# Patient Record
Sex: Male | Born: 1985 | Race: Black or African American | Hispanic: No | Marital: Single | Smoking: Current every day smoker
Health system: Southern US, Community
[De-identification: ages and names within clinical notes are randomized; demographics above are authoritative.]

## PROBLEM LIST (undated history)

## (undated) DIAGNOSIS — R05 Cough: Secondary | ICD-10-CM

## (undated) DIAGNOSIS — R059 Cough, unspecified: Secondary | ICD-10-CM

## (undated) DIAGNOSIS — D869 Sarcoidosis, unspecified: Secondary | ICD-10-CM

## (undated) HISTORY — DX: Cough, unspecified: R05.9

## (undated) HISTORY — DX: Cough: R05

## (undated) HISTORY — DX: Sarcoidosis, unspecified: D86.9

---
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
## 2010-07-22 ENCOUNTER — Emergency Department (HOSPITAL_BASED_OUTPATIENT_CLINIC_OR_DEPARTMENT_OTHER)
Admission: EM | Admit: 2010-07-22 | Discharge: 2010-07-22 | Payer: Self-pay | Source: Home / Self Care | Admitting: Emergency Medicine

## 2010-10-28 LAB — GLUCOSE, CAPILLARY: Glucose-Capillary: 109 mg/dL — ABNORMAL HIGH (ref 70–99)

## 2010-10-28 LAB — CULTURE, ROUTINE-ABSCESS

## 2011-05-06 ENCOUNTER — Encounter: Payer: Self-pay | Admitting: *Deleted

## 2011-05-06 ENCOUNTER — Emergency Department (INDEPENDENT_AMBULATORY_CARE_PROVIDER_SITE_OTHER): Payer: Self-pay

## 2011-05-06 ENCOUNTER — Emergency Department (HOSPITAL_BASED_OUTPATIENT_CLINIC_OR_DEPARTMENT_OTHER)
Admission: EM | Admit: 2011-05-06 | Discharge: 2011-05-07 | Disposition: A | Discharge status: Home / Self Care | Payer: Self-pay | Attending: Emergency Medicine | Admitting: Emergency Medicine

## 2011-05-06 DIAGNOSIS — J3489 Other specified disorders of nose and nasal sinuses: Secondary | ICD-10-CM | POA: Insufficient documentation

## 2011-05-06 DIAGNOSIS — R059 Cough, unspecified: Secondary | ICD-10-CM | POA: Insufficient documentation

## 2011-05-06 DIAGNOSIS — R0602 Shortness of breath: Secondary | ICD-10-CM

## 2011-05-06 DIAGNOSIS — F17200 Nicotine dependence, unspecified, uncomplicated: Secondary | ICD-10-CM | POA: Insufficient documentation

## 2011-05-06 DIAGNOSIS — R911 Solitary pulmonary nodule: Secondary | ICD-10-CM

## 2011-05-06 DIAGNOSIS — J984 Other disorders of lung: Secondary | ICD-10-CM | POA: Insufficient documentation

## 2011-05-06 DIAGNOSIS — R05 Cough: Secondary | ICD-10-CM

## 2011-05-06 NOTE — ED Notes (Signed)
Pt c/o pro cough x 3 months with chest congestion

## 2011-05-06 NOTE — ED Notes (Signed)
Pt has an hx of asthma and is a smoker but is presented to ED with a cough that has been going on for 3 months. Pt is no respiratory distress.

## 2011-05-07 ENCOUNTER — Emergency Department (INDEPENDENT_AMBULATORY_CARE_PROVIDER_SITE_OTHER): Payer: Self-pay

## 2011-05-07 ENCOUNTER — Inpatient Hospital Stay (HOSPITAL_COMMUNITY): Admission: AD | Admit: 2011-05-07 | Payer: Self-pay | Source: Other Acute Inpatient Hospital

## 2011-05-07 DIAGNOSIS — R05 Cough: Secondary | ICD-10-CM

## 2011-05-07 DIAGNOSIS — R0989 Other specified symptoms and signs involving the circulatory and respiratory systems: Secondary | ICD-10-CM

## 2011-05-07 DIAGNOSIS — J984 Other disorders of lung: Secondary | ICD-10-CM

## 2011-05-07 LAB — BASIC METABOLIC PANEL
GFR calc Af Amer: >60 mL/min (ref >60)
GFR calc non Af Amer: >60 mL/min (ref >60)
Glucose, Bld: 100 mg/dL — ABNORMAL HIGH (ref 70–99)
Potassium: 3.8 mEq/L (ref 3.5–5.1)
Sodium: 139 mEq/L (ref 135–145)

## 2011-05-07 LAB — CBC
Hemoglobin: 14.1 g/dL (ref 13.0–17.0)
MCHC: 36.9 g/dL — ABNORMAL HIGH (ref 30.0–36.0)
RDW: 13.7 % (ref 11.5–15.5)
WBC: 11.9 10*3/uL — ABNORMAL HIGH (ref 4.0–10.5)

## 2011-05-07 MED ORDER — METHYLPREDNISOLONE SODIUM SUCC 125 MG IJ SOLR
125.0000 mg | Freq: Once | INTRAMUSCULAR | Status: AC
  Administered 2011-05-07: 125 mg via INTRAVENOUS
  Filled 2011-05-07: qty 2

## 2011-05-07 MED ORDER — ALBUTEROL SULFATE HFA 108 (90 BASE) MCG/ACT IN AERS
2.0000 | INHALATION_SPRAY | Freq: Once | RESPIRATORY_TRACT | Status: AC
  Administered 2011-05-07: 2 via RESPIRATORY_TRACT
  Filled 2011-05-07: qty 6.7

## 2011-05-07 MED ORDER — IOHEXOL 300 MG/ML  SOLN
80.0000 mL | Freq: Once | INTRAMUSCULAR | Status: AC | PRN
  Administered 2011-05-07: 80 mL via INTRAVENOUS

## 2011-05-07 MED ORDER — DIPHENHYDRAMINE HCL 50 MG/ML IJ SOLN
50.0000 mg | Freq: Once | INTRAMUSCULAR | Status: AC
  Administered 2011-05-07: 50 mg via INTRAVENOUS
  Filled 2011-05-07: qty 1

## 2011-05-07 MED ORDER — FAMOTIDINE IN NACL 20-0.9 MG/50ML-% IV SOLN
20.0000 mg | Freq: Once | INTRAVENOUS | Status: AC
  Administered 2011-05-07: 20 mg via INTRAVENOUS
  Filled 2011-05-07: qty 50

## 2011-05-07 NOTE — ED Provider Notes (Signed)
I spoke with Dr. Sung Amabile of pulmonary critical care was evaluated the patient's chest x-ray and CT scan.  He questions whether or not whether or not the patient is to be admitted to the hospital.  At this time the patient is well appearing with normal vital signs including a normal O2 sat.  Dr Sung Amabile has patient's phone number and he'll be setting the patient up for bronchoscopy tomorrow.  Patient and family have been informed of this.  The patient be discharged home with an albuterol inhaler.  No antibiotics at this time.  The suspicion at this time by Dr. Sung Amabile is possible sarcoidosis although he will know further after evaluating the patient and bronchoscopy.  The patient has been instructed to lay low for the next several days and minimize his contact with public.  He bent over to the ER for any new or worsening symptoms.  Lyanne Co, MD 05/07/11 1102

## 2011-05-07 NOTE — ED Notes (Signed)
Pt return from CT, no adverse rxn noted. Pt updated on plan of care

## 2011-05-07 NOTE — ED Notes (Signed)
Pt moved to negative pressure room per MD order. Pt and family made aware of wait for inpt room at [facility]. No new complaints at this time.

## 2011-05-07 NOTE — ED Notes (Signed)
Family member noted holding baby and attempting to place mask on baby.  Family informed that no children may enter room as pt is on airborne precautions as explained earlier.  Family member verbalizes understanding, pt is waving to baby through window in door. Family member and baby noted leaving department to waiting room by nursing secretary.

## 2011-05-07 NOTE — ED Notes (Signed)
Pt mother now noted standing in door of room with door propped open speaking with family member from earlier notes. Mother is not wearing mask.  Mother asked to step out of room, keep door closed, and place mask.  Mother refuses, states "I'm not wearing that thing, and you can't stop me from seeing my son!" mother enters room and refuses to place mask despite encouragement from this rn.  Ophelia Shoulder, Interior and spatial designer and dr. Patria Mane alerted to situation. Family instructed to keep door closed at all times, and importance of precautions re-explained.  Mother states "I understand, I'm not stupid, but I am not wearing that damn thing and you are not going to keep me from my son!" mother enters room without mask in place.

## 2011-05-07 NOTE — ED Notes (Signed)
Family member noted going into room without mask, stopped by this rn and asked to place mask, airborne precautions explained.  Family member verbalizes understanding, but does not put on mask and attempts to open room door.  Family member instructed to apply mask before entering room, pt assisted with placing mask by this rn, and escorted into room.

## 2011-05-07 NOTE — ED Notes (Signed)
Pt and family updated on plan of care and need to wait one hour after being pre-medicated before pt can go to CT scanner. NAD noted, IV site unremarkable.

## 2011-05-07 NOTE — ED Notes (Signed)
Pt sleeping with resp easy and reg, arouses to spoken name to deny any c/o.  Pt remains on airborne precautions per protocol.

## 2011-05-07 NOTE — ED Provider Notes (Signed)
History     CSN: [id number] Arrival date & time: 05/06/2011 10:05 PM   Chief Complaint  Patient presents with  . Nasal Congestion  . Cough     (Include location/radiation/quality/duration/timing/severity/associated sxs/prior treatment) Patient is a 25 y.o. male presenting with cough. The history is provided by the patient. No language interpreter was used.  Cough This is a new problem. The current episode started more than 1 week ago. The problem occurs constantly. The problem has not changed since onset.The cough is non-productive. There has been no fever. Pertinent negatives include no chest pain, no chills, no sweats, no weight loss, no ear congestion, no ear pain, no headaches, no rhinorrhea, no sore throat, no myalgias, no shortness of breath, no wheezing and no eye redness. He has tried nothing for the symptoms. The treatment provided no relief. Risk factors: smoker. He is a smoker. His past medical history does not include bronchitis, pneumonia, bronchiectasis, COPD, emphysema or asthma.     Past Medical History  Diagnosis Date  . Asthma      History reviewed. No pertinent past surgical history.  History reviewed. No pertinent family history.  History  Substance Use Topics  . Smoking status: Current Everyday Smoker -- 1.0 packs/day  . Smokeless tobacco: Not on file  . Alcohol Use: No      Review of Systems  Constitutional: Negative for fever, chills, weight loss, diaphoresis, activity change, appetite change, fatigue and unexpected weight change.  HENT: Negative for ear pain, sore throat, facial swelling, rhinorrhea and neck pain.   Eyes: Negative for discharge and redness.  Respiratory: Positive for cough. Negative for choking, shortness of breath, wheezing and stridor.   Cardiovascular: Negative for chest pain, palpitations and leg swelling.  Gastrointestinal: Negative for abdominal distention.  Genitourinary: Negative for difficulty urinating.  Musculoskeletal:  Negative for myalgias and arthralgias.  Neurological: Negative for headaches.  Hematological: Negative.   Psychiatric/Behavioral: Negative.     Allergies  Aspirin; Bee; Ivp dye; and Shellfish allergy  Home Medications   Current Outpatient Rx  Name Route Sig Dispense Refill  . HYDROCORTISONE 1 % EX LOTN Topical Apply topically 2 (two) times daily as needed. For eczema       Physical Exam    BP 122/66  Pulse 80  Temp 97.8 F (36.6 C)  Resp 15  Ht 5\' 6"  (1.676 m)  Wt 220 lb (99.791 kg)  BMI 35.51 kg/m2  SpO2 99%  Physical Exam  Constitutional: He is oriented to person, place, and time. He appears well-developed and well-nourished.  HENT:  Head: Normocephalic and atraumatic.  Eyes: EOM are normal. Pupils are equal, round, and reactive to light.  Neck: Normal range of motion. Neck supple. No JVD present. No tracheal deviation present. No thyromegaly present.  Cardiovascular: Normal rate and regular rhythm.   Pulmonary/Chest: Effort normal and breath sounds normal. No stridor. No respiratory distress. He has no wheezes. He has no rales. He exhibits no tenderness.  Abdominal: Soft. Bowel sounds are normal. He exhibits no distension.  Musculoskeletal: Normal range of motion.  Lymphadenopathy:    He has no cervical adenopathy.  Neurological: He is alert and oriented to person, place, and time.  Skin: Skin is warm and dry.  Psychiatric: He has a normal mood and affect.    ED Course  Procedures  Results for orders placed during the hospital encounter of 05/06/11  CBC      Component Value Range   WBC 11.9 (*) 4.0 - 10.5 (K/uL)  RBC 5.04  4.22 - 5.81 (MIL/uL)   Hemoglobin 14.1  13.0 - 17.0 (g/dL)   HCT 16.1 (*) 09.6 - 52.0 (%)   MCV 75.8 (*) 78.0 - 100.0 (fL)   MCH 28.0  26.0 - 34.0 (pg)   MCHC 36.9 (*) 30.0 - 36.0 (g/dL)   RDW 04.5  40.9 - 81.1 (%)   Platelets 336  150 - 400 (K/uL)  BASIC METABOLIC PANEL      Component Value Range   Sodium 139  135 - 145 (mEq/L)    Potassium 3.8  3.5 - 5.1 (mEq/L)   Chloride 99  96 - 112 (mEq/L)   CO2 28  19 - 32 (mEq/L)   Glucose, Bld 100 (*) 70 - 99 (mg/dL)   BUN 13  6 - 23 (mg/dL)   Creatinine, Ser 9.14 (*) 0.50 - 1.35 (mg/dL)   Calcium 9.5  8.4 - 78.2 (mg/dL)   GFR calc non Af Amer >60  >60 (mL/min)   GFR calc Af Amer >60  >60 (mL/min)   Dg Chest 2 View  05/06/2011  *RADIOLOGY REPORT*  Clinical Data: 25 year old male with chronic cough.  Shortness of breath.  CHEST - 2 VIEW  Comparison: None  Findings: The cardiomediastinal silhouette is unremarkable. Bilateral pulmonary nodular/ mass-like opacities are identified. There is no evidence of pleural effusion or pneumothorax. No acute bony abnormalities are present.  IMPRESSION: Bilateral pulmonary nodular/mass-like opacities suspicious for metastatic disease or atypical infection.  Chest CT with contrast is recommended for further evaluation.  Original Report Authenticated By: Rosendo Gros, M.D.     No diagnosis found.   MDM Case d/w Dr. Sherene Sires, will need isolation and bronch, admit to medicine with pulmonary consult       Nazier Neyhart K Osmar Howton-Rasch, MD 05/07/11 9562

## 2011-05-07 NOTE — ED Notes (Signed)
Pt and family made aware of wait for inpt bed, IV site unremarkable, no rxn to CT dye noted.

## 2011-05-08 ENCOUNTER — Ambulatory Visit (HOSPITAL_COMMUNITY)
Admission: RE | Admit: 2011-05-08 | Discharge: 2011-05-08 | Disposition: A | Discharge status: Home / Self Care | Payer: Self-pay | Source: Ambulatory Visit | Attending: Pulmonary Disease | Admitting: Pulmonary Disease

## 2011-05-08 ENCOUNTER — Other Ambulatory Visit: Payer: Self-pay | Admitting: Pulmonary Disease

## 2011-05-08 ENCOUNTER — Encounter (HOSPITAL_COMMUNITY): Payer: Self-pay

## 2011-05-08 ENCOUNTER — Ambulatory Visit (HOSPITAL_COMMUNITY)
Admit: 2011-05-08 | Discharge: 2011-05-08 | Disposition: A | Discharge status: Home / Self Care | Payer: Self-pay | Source: Ambulatory Visit | Attending: Pulmonary Disease | Admitting: Pulmonary Disease

## 2011-05-08 DIAGNOSIS — R918 Other nonspecific abnormal finding of lung field: Secondary | ICD-10-CM

## 2011-05-08 DIAGNOSIS — R05 Cough: Secondary | ICD-10-CM

## 2011-05-08 DIAGNOSIS — R52 Pain, unspecified: Secondary | ICD-10-CM

## 2011-05-08 DIAGNOSIS — R040 Epistaxis: Secondary | ICD-10-CM | POA: Insufficient documentation

## 2011-05-08 DIAGNOSIS — Z01818 Encounter for other preprocedural examination: Secondary | ICD-10-CM | POA: Insufficient documentation

## 2011-05-08 LAB — PNEUMOCYSTIS JIROVECI SMEAR BY DFA: Pneumocystis jiroveci Ag: NEGATIVE

## 2011-05-11 ENCOUNTER — Encounter: Payer: Self-pay | Admitting: Critical Care Medicine

## 2011-05-11 ENCOUNTER — Telehealth: Payer: Self-pay | Admitting: Critical Care Medicine

## 2011-05-11 ENCOUNTER — Other Ambulatory Visit: Payer: Self-pay | Admitting: Critical Care Medicine

## 2011-05-11 ENCOUNTER — Ambulatory Visit (HOSPITAL_BASED_OUTPATIENT_CLINIC_OR_DEPARTMENT_OTHER)
Admission: RE | Admit: 2011-05-11 | Discharge: 2011-05-11 | Disposition: A | Discharge status: Home / Self Care | Payer: Managed Care, Other (non HMO) | Source: Ambulatory Visit | Attending: Critical Care Medicine | Admitting: Critical Care Medicine

## 2011-05-11 ENCOUNTER — Ambulatory Visit (INDEPENDENT_AMBULATORY_CARE_PROVIDER_SITE_OTHER): Payer: Self-pay | Admitting: Critical Care Medicine

## 2011-05-11 VITALS — BP 110/70 | HR 70 | Temp 97.9°F | Ht 66.0 in | Wt 195.0 lb

## 2011-05-11 VITALS — BP 110/70 | HR 70 | Temp 97.9°F | Ht 66.0 in

## 2011-05-11 DIAGNOSIS — R918 Other nonspecific abnormal finding of lung field: Secondary | ICD-10-CM | POA: Insufficient documentation

## 2011-05-11 DIAGNOSIS — D860 Sarcoidosis of lung: Secondary | ICD-10-CM | POA: Insufficient documentation

## 2011-05-11 LAB — MISCELLANEOUS TEST

## 2011-05-11 NOTE — Assessment & Plan Note (Signed)
Bilateral nodular mass like infiltrates centered around airways  ?sarcoidosis vs other infectious process.  FOB 05/08/11 results still pending.  PPD neg Bronch wash neg for fungi or AFB,  Aerobic c/s was not sent  Plan Lab assay for broad serologic markers  F/u FOB if nondiagnostic , pt will need ENB  I will have 9/20 CT chest made into Super D cuts  No med changes as of yet

## 2011-05-11 NOTE — Progress Notes (Signed)
  Subjective:    Patient ID: Cody Peterson, male    DOB: 22-May-1986, 25 y.o.   MRN: [id number]  HPI 3-4 months of cough.  Was dry until worse over past week with now more productive of yellow .  Notes some blood post procedure.  No chest pain.  No fever.  No joint pain or fever.   Exsmoker.    Review of Systems Constitutional:   No  weight loss, night sweats,  Fevers, chills, fatigue, lassitude. HEENT:   No headaches,  Difficulty swallowing,  Tooth/dental problems,  Sore throat,                No sneezing, itching, ear ache, nasal congestion, post nasal drip,   CV:  No chest pain,  Orthopnea, PND, swelling in lower extremities, anasarca, dizziness, palpitations  GI  No heartburn, indigestion, abdominal pain, nausea, vomiting, diarrhea, change in bowel habits, loss of appetite  Resp: No shortness of breath with exertion or at rest.  No excess mucus, no productive cough,  No non-productive cough,  No coughing up of blood.  No change in color of mucus.  No wheezing.  No chest wall deformity  Skin: no rash or lesions.  GU: no dysuria, change in color of urine, no urgency or frequency.  No flank pain.  MS:  No joint pain or swelling.  No decreased range of motion.  No back pain.  Psych:  No change in mood or affect. No depression or anxiety.  No memory loss.     Objective:   Physical Exam  Filed Vitals:   05/11/11 1112  BP: 110/70  Pulse: 70  Temp: 97.9 F (36.6 C)  TempSrc: Oral  Height: 5\' 6"  (1.676 m)  Weight: 195 lb (88.451 kg)  SpO2: 98%    Gen: Pleasant, well-nourished, in no distress,  normal affect  ENT: No lesions,  mouth clear,  oropharynx clear, no postnasal drip  Neck: No JVD, no TMG, no carotid bruits  Lungs: No use of accessory muscles, no dullness to percussion, clear without rales or rhonchi  Cardiovascular: RRR, heart sounds normal, no murmur or gallops, no peripheral edema  Abdomen: soft and NT, no HSM,  BS normal  Musculoskeletal: No deformities,  no cyanosis or clubbing  Neuro: alert, non focal  Skin: Warm, no lesions or rashes       Assessment & Plan:

## 2011-05-11 NOTE — Progress Notes (Signed)
Subjective:    Patient ID: Cody Peterson, male    DOB: [birth year], 25 y.o.   MRN: [id number]  HPI 25 y.o.AAM with pulmonary infiltrates here for f/u and outpt pulm establish  Pt ill with cough for several months, more dyspnea noted and pt presented to ED one week ago.  Pt seen by [facility] and had FOB 9/21.  Results are pending except AFB and fungal smears of bronch washings were neg.   Pt now with continued cough productive of yellow mucus.  No chest pain.  Pt had PPD neg.  No f/c/s.  Pt here for followup post bronch as Dr simonds does not come to [facility] . Pt denies any significant sore throat, nasal congestion or excess secretions, fever, chills, sweats, unintended weight loss, pleurtic or exertional chest pain, orthopnea PND, or leg swelling Pt denies any increase in rescue therapy over baseline, denies waking up needing it or having any early am or nocturnal exacerbations of coughing/wheezing/or dyspnea. Pt also denies any obvious fluctuation in symptoms with  weather or environmental change or other alleviating or aggravating factors    Past Medical History  Diagnosis Date  . Asthma      History reviewed. No pertinent family history.   History   Social History  . Marital Status: Married    Spouse Name: N/A    Number of Children: N/A  . Years of Education: N/A   Occupational History  . Not on file.   Social History Main Topics  . Smoking status: Current Everyday Smoker -- 1.0 packs/day  . Smokeless tobacco: Not on file  . Alcohol Use: No  . Drug Use: No  . Sexually Active: No   Other Topics Concern  . Not on file   Social History Narrative  . No narrative on file     Allergies  Allergen Reactions  . Aspirin Nausea And Vomiting  . Bee Swelling  . Ivp Dye (Iodinated Diagnostic Agents) Swelling  . Shellfish Allergy Swelling     Outpatient Prescriptions Prior to Visit  Medication Sig Dispense Refill  . hydrocortisone 1 % lotion Apply topically 2 (two)  times daily as needed. For eczema           Review of Systems Constitutional:   No  weight loss, night sweats,  Fevers, chills, fatigue, lassitude. HEENT:   No headaches,  Difficulty swallowing,  Tooth/dental problems,  Sore throat,                No sneezing, itching, ear ache, nasal congestion, post nasal drip,   CV:  No chest pain,  Orthopnea, PND, swelling in lower extremities, anasarca, dizziness, palpitations  GI  No heartburn, indigestion, abdominal pain, nausea, vomiting, diarrhea, change in bowel habits, loss of appetite  Resp: Notes  shortness of breath with exertion not  at rest.  No excess mucus, notes  productive cough,  No non-productive cough,  Notes  coughing up of blood after FOB.  No change in color of mucus.  No wheezing.  No chest wall deformity  Skin: no rash or lesions.  GU: no dysuria, change in color of urine, no urgency or frequency.  No flank pain.  MS:  No joint pain or swelling.  No decreased range of motion.  No back pain.  Psych:  No change in mood or affect. No depression or anxiety.  No memory loss.     Objective:   Physical Exam Filed Vitals:   05/11/11 1145  BP: 110/70  Pulse: 70  Temp: 97.9 F (36.6 C)  TempSrc: Oral  Height: 5\' 6"  (1.676 m)  SpO2: 98%    Gen: Pleasant, well-nourished, in no distress,  normal affect  ENT: No lesions,  mouth clear,  oropharynx clear, no postnasal drip  Neck: No JVD, no TMG, no carotid bruits  Lungs: No use of accessory muscles, no dullness to percussion, clear without rales or rhonchi  Cardiovascular: RRR, heart sounds normal, no murmur or gallops, no peripheral edema  Abdomen: soft and NT, no HSM,  BS normal  Musculoskeletal: No deformities, no cyanosis or clubbing  Neuro: alert, non focal  Skin: Warm, no lesions or rashes  CT Chest 05/07/11 Findings: There are bilateral nodular masses, many of which are  greater than 3 cm, and several of which demonstrate early signs of  central cavitation.  These nodules/masses are centered along  bronchioles and more confluent areas of consolidation for example  within the right middle lobe demonstrate air bronchograms. No  pleural effusion or pneumothorax. There are enlarged mediastinal  and hilar lymph nodes, with a right subcarinal lymph node measuring  greater than 1.6 cm short axis. A right infrahilar node measures  1.6 cm short axis.  No main branch pulmonary artery filling defect. The aorta is of  normal course and caliber. The heart size is within normal limits.  Limited images through the upper abdomen show no acute abnormality.  No acute or aggressive osseous abnormality.  IMPRESSION:  Confluent nodules/masses throughout all lobes, many of which  demonstrate air bronchograms. The pattern is unusual, though  favored to represent an endobronchial spread. Atypical and fungal  infections should be considered as well as inflammatory conditions  such as sarcoidosis, and lymphoma in the setting of mediastinal and  hilar lymphadenopathy. The pattern is not typical for hematogenous  metastases. Recommend pulmonary consultation and consider  bronchoscopy.       Assessment & Plan:   Pulmonary infiltrates Bilateral nodular mass like infiltrates centered around airways  ?sarcoidosis vs other infectious process.  FOB 05/08/11 results still pending.  PPD neg Bronch wash neg for fungi or AFB,  Aerobic c/s was not sent  Plan Lab assay for broad serologic markers  F/u FOB if nondiagnostic , pt will need ENB  I will have 9/20 CT chest made into Super D cuts  No med changes as of yet     Updated Medication List Outpatient Encounter Prescriptions as of 05/11/2011  Medication Sig Dispense Refill  . hydrocortisone 1 % lotion Apply topically 2 (two) times daily as needed. For eczema

## 2011-05-11 NOTE — Patient Instructions (Signed)
Labs today We may have to perform another bronchoscopy in the operating room depending upon the results of the bronchoscopy from last week I will call you this afternoon with the bronchoscopy results Return [place] office in two weeks

## 2011-05-11 NOTE — Telephone Encounter (Signed)
I spoke with pt mother and she is requesting pt's biopsy results. They are aware PW is not in the office this afternoon and is fine with a call back tomorrow. Please advise Dr. Delford Field. Thanks  Carver Fila, CMA

## 2011-05-12 ENCOUNTER — Encounter: Payer: Self-pay | Admitting: Critical Care Medicine

## 2011-05-12 ENCOUNTER — Telehealth: Payer: Self-pay | Admitting: Critical Care Medicine

## 2011-05-12 DIAGNOSIS — D869 Sarcoidosis, unspecified: Secondary | ICD-10-CM

## 2011-05-12 DIAGNOSIS — R918 Other nonspecific abnormal finding of lung field: Secondary | ICD-10-CM

## 2011-05-12 LAB — HEPATIC FUNCTION PANEL
AST: 48 U/L — ABNORMAL HIGH (ref 0–37)
Bilirubin, Direct: 0.1 mg/dL (ref 0.0–0.3)
Total Bilirubin: 0.3 mg/dL (ref 0.3–1.2)

## 2011-05-12 LAB — ANGIOTENSIN CONVERTING ENZYME: Angiotensin-Converting Enzyme: >100 U/L — ABNORMAL HIGH (ref 8–52)

## 2011-05-12 LAB — RHEUMATOID FACTOR: Rhuematoid fact SerPl-aCnc: <10 IU/mL (ref <=14)

## 2011-05-12 LAB — IGG, IGA, IGM: IgM, Serum: 80 mg/dL (ref 41–251)

## 2011-05-12 MED ORDER — PREDNISONE 10 MG PO TABS: ORAL_TABLET | ORAL | Status: DC

## 2011-05-12 NOTE — Telephone Encounter (Signed)
Tbbx pos non caseating granulomas Pt aware , pred Rx sent  Pt has sarcoidosis

## 2011-05-12 NOTE — Telephone Encounter (Signed)
Pt aware of results 

## 2011-05-13 LAB — ALLERGY FULL PROFILE
Bahia Grass: <0.1 kU/L (ref <0.35)
Box Elder IgE: 0.14 kU/L (ref <0.35)
Curvularia lunata: <0.1 kU/L (ref <0.35)
Elm IgE: <0.1 kU/L (ref <0.35)
Fescue: <0.1 kU/L (ref <0.35)
G005 Rye, Perennial: <0.1 kU/L (ref <0.35)
G009 Red Top: <0.1 kU/L (ref <0.35)
Goldenrod: 0.21 kU/L (ref <0.35)
Helminthosporium halodes: <0.1 kU/L (ref <0.35)
Sycamore Tree: 0.11 kU/L (ref <0.35)

## 2011-05-13 NOTE — Op Note (Addendum)
NAMEASCENCION, COYE              ACCOUNT NO.:  [id number]  MEDICAL RECORD NO.:  [id number]  LOCATION:                               FACILITY:  [facility]  PHYSICIAN:  Oley Balm. Sung Amabile, MD   DATE OF BIRTH:  1986-01-09  DATE OF PROCEDURE:  05/08/2011 DATE OF DISCHARGE:                              OPERATIVE REPORT   INDICATIONS:  Bilateral pulmonary opacities - rule out atypical infection versus sarcoidosis.  ANESTHESIA:  Topical anesthesia was applied to the nose and throat and 40 mL of 1% lidocaine were used during the course of the procedure.  PREMEDICATIONS:  Fentanyl 50 mcg IV, Versed 4 mg IV.  During the course of the procedure an additional 50 mcg of fentanyl and 2 mg of Versed were administered.  Also, phenylephrine nasal spray was applied to the right naris prior to initiation of the procedure.  PROCEDURE IN DETAILS:  After informed consent was obtained and after adequate sedation and anesthesia were administered, the bronchoscope was introduced via the right naris and advanced in the posterior pharynx. This demonstrated normal upper airway anatomy except for a small papular lesion on the posterior pharynx just above the larynx.  Vocal cords moved symmetrically.  After further lidocaine was administered via the bronchoscope, the scope was advanced in the trachea.  Subsequent to this approximately 40 mL of 1% lidocaine were administered into the airways and an airway examination was performed.  This demonstrated normal segmental airway arrangement.  There were mucosal changes consistent with mild to moderate chronic bronchitis.  There were no endobronchial tumors, masses or foreign bodies.  After completion of the airway examination, the bronchoscope was advanced in the posterior segment of the right upper lobe where a bronchial alveolar lavage was performed with 100 mL of normal saline.  Approximately 30 mL of cloudy effluent returned and this specimen was sent for cytology  and microbiology studies including AFB, fungal, respiratory viral cultures.  Subsequently the bronchoscope was advanced into the anterolateral basilar segment of the left lower lobe.  From this segment transbronchial biopsies were obtained x3 passes.  This was complicated by extreme excessive coughing making performance of safe transbronchial biopsies very difficult. Further anesthesia with lidocaine was administered but without a benefit.  Therefore, the procedure was aborted.  The scope was removed after assurance that there was no significant airway bleeding.  After removal of the bronchoscope the patient's status was stable.  The only other complication was very mild epistaxis which resolved by the end of the procedure.  The biopsy specimens were sent for surgical pathology. I should also mention that the washing specimens were sent for cytology. Lastly, prior to performance of the transbronchial biopsy, brush biopsy of the left lower lobe was also obtained and this specimen was sent for cytology and AFB studies.  IMPRESSION:  Bilateral pulmonary opacities of unclear etiology.  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  Rule out atypical infections.  PLAN:  The patient will be discharged home after bronchoscopy.  He will follow up with Dr. Delford Field at [facility] on May 11, 2011.  Further evaluation and management will be dictated by Dr. Delford Field.     Oley Balm Simonds,  MD     DBS/MEDQ  D:  05/08/2011  T:  05/08/2011  Job:  [id number]  Electronically Signed by Billy Fischer MD on 06/10/2011 12:45:10 AM

## 2011-05-13 NOTE — Consult Note (Signed)
Cody Peterson, Cody Peterson              ACCOUNT NO.:  [id number]  MEDICAL RECORD NO.:  [id number]  LOCATION:                                 FACILITY:  PHYSICIAN:  Oley Balm. Sung Amabile, MD   DATE OF BIRTH:  11-Apr-1986  DATE OF CONSULTATION:  05/08/2011 DATE OF DISCHARGE:                                CONSULTATION   REFERRING PHYSICIAN:  April Palumbo-Rasch, MD  REASON FOR CONSULTATION:  Bilateral nodular infiltrates, rule out atypical infection versus sarcoidosis.  HISTORY OF PRESENT ILLNESS:  Cody Peterson is a 25 year old African American gentleman who presented to the [facility] Emergency Department on May 07, 2011 with a complaint of 3-4 months of cough.  At that time, a chest x-ray was performed which revealed bilateral ill-defined infiltrates and a CT scan of the chest was performed in followup.  This demonstrated the findings discussed below.  Based on the CT scan findings, the concern was for the possibility of an atypical infection including possible tuberculosis. The original plan was for him to be admitted to an isolation room at [facility] on May 07, 2011.  I reviewed the CT scan of the chest on that day and discussed with Dr. Patria Mane who had assumed his care at [facility].  I did not feel that this patient required hospitalization.  We made arrangements for him to return for outpatient bronchoscopy on the day of this dictation.  Also, this pulmonary consultation was performed on that day.  Upon my evaluation, the patient reported only cough.  He had no fevers, chills, or sweats. He had no hemoptysis or chest pain.  He had no weight loss and denied any lymphadenopathy.  Other than cough, he had been feeling generally well, though he did note some increased exertional dyspnea while playing basketball.  PAST MEDICAL HISTORY:  Entirely negative otherwise.  He has had no major hospitalizations in the past and no major surgeries.   He takes no chronic medications.  FAMILY HISTORY:  This has been reviewed and is noncontributory except for the fact that his father suffered active tuberculosis back in the 72s.  He was treated with medical therapy at that time.  The patient has had no exposure to anyone known to have active tuberculosis.  SOCIAL HISTORY:  The patient smokes a pack of cigarettes per day.  He also smokes marijuana daily.  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  He is married with 1 child and lives independently.  REVIEW OF SYSTEMS:  As per the history of present illness.  Otherwise, detailed review of systems is negative other than problems with chronic "allergies" with nasal symptoms.  PHYSICAL EXAMINATION:  VITAL SIGNS:  At the time of my evaluation the patient was afebrile with normal vital signs.  Oxygen saturation was 98% on room air. GENERAL:  He is well developed, well nourished in no acute cardiac or respiratory distress. HEENT:  Revealed normal tympanic canals and membranes.  Nasal passages were patent and nasal mucosa was pink and moist.  Oral mucosa was pink and moist.  There were no oral lesions noted. NECK:  Supple without adenopathy or jugular venous distention.  Thyroid gland  was not palpable. CHEST:  Revealed normal percussion noted throughout.  Breath sounds were full without adventitious sounds. CARDIAC:  Revealed regular rate and rhythm with no murmurs. ABDOMEN:  Soft, nontender with normal bowel sounds. EXTREMITIES:  Without clubbing, cyanosis, or edema. NEUROLOGIC:  Revealed no focal deficits.  A full lymph node survey was entirely negative.  DATA:  Chest x-ray revealed bilateral ill-defined opacities.  CT scan of the chest revealed multiple bilateral opacities many of which had a ill- defined nodular appearance.  Many of these also had air bronchograms and there was confluence of these opacities in the left lower lobe.  Also, there was modest right hilar adenopathy with the largest  lymph node measuring 1.6 cm.  IMPRESSION: 1. Chronic cough of 3-4 months duration. 2. Bilateral ill-defined nodular and mass opacities - differential     diagnosis does indeed include atypical infection though the patient     has had few symptoms to suggest an infectious process.  I think     tuberculosis would be exceedingly unlikely given the radiographic     appearance.  The patient has no known immunodeficiency making     fungal infections also unlikely.  The most likely diagnosis would     be sarcoidosis.  Other possibilities, though less likely, would     include metastatic disease such as renal cell carcinoma or     testicular cancer.  PLAN:  Bronchoscopy for washings, brushings, and transbronchial biopsy. PPD will be placed.  Follow up with Dr. Delford Field at [facility] on Monday, May 11, 2011.     Oley Balm Sung Amabile, MD     DBS/MEDQ  D:  05/08/2011  T:  05/08/2011  Job:  [id number]  Electronically Signed by Billy Fischer MD on 05/13/2011 05:41:34 PM

## 2011-05-17 LAB — HYPERSENSITIVITY PNUEMONITIS PROFILE

## 2011-05-17 LAB — FUNGAL ANTIBODIES PANEL, ID-BLOOD
Aspergillus Flavus Antibodies: NEGATIVE
Aspergillus fumigatus: NEGATIVE
Blastomyces Abs, Qn, DID: NEGATIVE
Coccidioides Antibody ID: NEGATIVE

## 2011-05-25 ENCOUNTER — Encounter: Payer: Self-pay | Admitting: Critical Care Medicine

## 2011-05-25 ENCOUNTER — Ambulatory Visit: Payer: Self-pay | Admitting: Critical Care Medicine

## 2011-05-28 ENCOUNTER — Ambulatory Visit: Payer: Self-pay | Admitting: Critical Care Medicine

## 2011-06-04 ENCOUNTER — Ambulatory Visit (INDEPENDENT_AMBULATORY_CARE_PROVIDER_SITE_OTHER): Payer: Self-pay | Admitting: Critical Care Medicine

## 2011-06-04 ENCOUNTER — Ambulatory Visit (HOSPITAL_BASED_OUTPATIENT_CLINIC_OR_DEPARTMENT_OTHER)
Admission: RE | Admit: 2011-06-04 | Discharge: 2011-06-04 | Disposition: A | Discharge status: Home / Self Care | Payer: Self-pay | Source: Ambulatory Visit | Attending: Critical Care Medicine | Admitting: Critical Care Medicine

## 2011-06-04 ENCOUNTER — Encounter: Payer: Self-pay | Admitting: Critical Care Medicine

## 2011-06-04 VITALS — BP 118/86 | HR 67 | Temp 98.0°F | Ht 66.0 in | Wt 204.0 lb

## 2011-06-04 DIAGNOSIS — F17200 Nicotine dependence, unspecified, uncomplicated: Secondary | ICD-10-CM | POA: Insufficient documentation

## 2011-06-04 DIAGNOSIS — R059 Cough, unspecified: Secondary | ICD-10-CM | POA: Insufficient documentation

## 2011-06-04 DIAGNOSIS — D869 Sarcoidosis, unspecified: Secondary | ICD-10-CM

## 2011-06-04 DIAGNOSIS — R05 Cough: Secondary | ICD-10-CM

## 2011-06-04 DIAGNOSIS — J984 Other disorders of lung: Secondary | ICD-10-CM | POA: Insufficient documentation

## 2011-06-04 DIAGNOSIS — J99 Respiratory disorders in diseases classified elsewhere: Secondary | ICD-10-CM

## 2011-06-04 DIAGNOSIS — D860 Sarcoidosis of lung: Secondary | ICD-10-CM

## 2011-06-04 LAB — FUNGUS CULTURE W SMEAR: Fungal Smear: NONE SEEN

## 2011-06-04 MED ORDER — PREDNISONE 10 MG PO TABS: ORAL_TABLET | ORAL | Status: DC

## 2011-06-04 MED ORDER — VARENICLINE TARTRATE 0.5 MG PO TABS: 0.5000 mg | ORAL_TABLET | Freq: Two times a day (BID) | ORAL | Status: DC

## 2011-06-04 NOTE — Progress Notes (Signed)
Subjective:    Patient ID: Cody Peterson, male    DOB: 1985-12-02, 25 y.o.   MRN: [id number]  HPI  25 y.o.AAM with pulmonary infiltrates here for f/u and outpt pulm establish  Pt ill with cough for several months, more dyspnea noted and pt presented to ED one week ago.  Pt seen by [facility] and had FOB 9/21.  Results are pending except AFB and fungal smears of bronch washings were neg.   Pt now with continued cough productive of yellow mucus.  No chest pain.  Pt had PPD neg.  No f/c/s.  Pt here for followup post bronch as Dr simonds does not come to [facility] .  06/04/2011 Fob pos sarcoid.  Overall the pt is improved. Less cough and dyspnea. Pt denies any significant sore throat, nasal congestion or excess secretions, fever, chills, sweats, unintended weight loss, pleurtic or exertional chest pain, orthopnea PND, or leg swelling Pt denies any increase in rescue therapy over baseline, denies waking up needing it or having any early am or nocturnal exacerbations of coughing/wheezing/or dyspnea. Pt also denies any obvious fluctuation in symptoms with  weather or environmental change or other alleviating or aggravating factors    Past Medical History  Diagnosis Date  . Asthma   . Cough   . Sarcoidosis      Family History  Problem Relation Age of Onset  . Tuberculosis Father   . Lung cancer Maternal Grandfather   . Colon cancer Maternal Uncle   . Stomach cancer Paternal Grandmother      History   Social History  . Marital Status: Married    Spouse Name: N/A    Number of Children: 1  . Years of Education: N/A   Occupational History  . [employer]    Social History Main Topics  . Smoking status: Current Some Day Smoker -- 0.1 packs/day    Types: Cigarettes  . Smokeless tobacco: Never Used   Comment: started smoking at age 80  . Alcohol Use: No  . Drug Use: No  . Sexually Active: No   Other Topics Concern  . Not on file   Social History Narrative  . No narrative on  file     Allergies  Allergen Reactions  . Aspirin Nausea And Vomiting  . Bee Swelling  . Ivp Dye (Iodinated Diagnostic Agents) Swelling  . Shellfish Allergy Swelling     Outpatient Prescriptions Prior to Visit  Medication Sig Dispense Refill  . hydrocortisone 1 % lotion Apply topically 2 (two) times daily as needed. For eczema       . predniSONE (DELTASONE) 10 MG tablet Take 4 for four days 3 for four days then 2 daily  100 tablet  6      Review of Systems  Constitutional:   No  weight loss, night sweats,  Fevers, chills, fatigue, lassitude. HEENT:   No headaches,  Difficulty swallowing,  Tooth/dental problems,  Sore throat,                No sneezing, itching, ear ache, nasal congestion, post nasal drip,   CV:  No chest pain,  Orthopnea, PND, swelling in lower extremities, anasarca, dizziness, palpitations  GI  No heartburn, indigestion, abdominal pain, nausea, vomiting, diarrhea, change in bowel habits, loss of appetite  Resp: No  shortness of breath with exertion not  at rest.  No excess mucus, no productive cough,  No non-productive cough,  No  coughing up of blood  No change  in color of mucus.  No wheezing.  No chest wall deformity  Skin: no rash or lesions.  GU: no dysuria, change in color of urine, no urgency or frequency.  No flank pain.  MS:  No joint pain or swelling.  No decreased range of motion.  No back pain.  Psych:  No change in mood or affect. No depression or anxiety.  No memory loss.     Objective:   Physical Exam  Filed Vitals:   06/04/11 1249  BP: 118/86  Pulse: 67  Temp: 98 F (36.7 C)  TempSrc: Oral  Height: 5\' 6"  (1.676 m)  Weight: 204 lb (92.534 kg)  SpO2: 99%    Gen: Pleasant, well-nourished, in no distress,  normal affect  ENT: No lesions,  mouth clear,  oropharynx clear, no postnasal drip  Neck: No JVD, no TMG, no carotid bruits  Lungs: No use of accessory muscles, no dullness to percussion, clear without rales or  rhonchi  Cardiovascular: RRR, heart sounds normal, no murmur or gallops, no peripheral edema  Abdomen: soft and NT, no HSM,  BS normal  Musculoskeletal: No deformities, no cyanosis or clubbing  Neuro: alert, non focal  Skin: Warm, no lesions or rashes  CT Chest 05/07/11 Findings: There are bilateral nodular masses, many of which are  greater than 3 cm, and several of which demonstrate early signs of  central cavitation. These nodules/masses are centered along  bronchioles and more confluent areas of consolidation for example  within the right middle lobe demonstrate air bronchograms. No  pleural effusion or pneumothorax. There are enlarged mediastinal  and hilar lymph nodes, with a right subcarinal lymph node measuring  greater than 1.6 cm short axis. A right infrahilar node measures  1.6 cm short axis.  No main branch pulmonary artery filling defect. The aorta is of  normal course and caliber. The heart size is within normal limits.  Limited images through the upper abdomen show no acute abnormality.  No acute or aggressive osseous abnormality.  IMPRESSION:  Confluent nodules/masses throughout all lobes, many of which  demonstrate air bronchograms. The pattern is unusual, though  favored to represent an endobronchial spread. Atypical and fungal  infections should be considered as well as inflammatory conditions  such as sarcoidosis, and lymphoma in the setting of mediastinal and  hilar lymphadenopathy. The pattern is not typical for hematogenous  metastases. Recommend pulmonary consultation and consider  bronchoscopy.       Assessment & Plan:   Sarcoidosis of lung Bilateral nodular mass like infiltrates centered around airways  PPD neg Fob 05/08/11: Bronch wash neg for fungi or AFB,  Aerobic c/s was not sent TBBX pos non caseating granulomas c/w sarcoidosis IMproved symptoms with steroids but pt still smoking Plan Slow pred taper Stop smoking with chantix Repeat  CXR     Updated Medication List Outpatient Encounter Prescriptions as of 06/04/2011  Medication Sig Dispense Refill  . albuterol (PROVENTIL HFA;VENTOLIN HFA) 108 (90 BASE) MCG/ACT inhaler Inhale 2 puffs into the lungs every 6 (six) hours as needed.        . hydrocortisone 1 % lotion Apply topically 2 (two) times daily as needed. For eczema       . predniSONE (DELTASONE) 10 MG tablet Reduce to 1 and 1/2 tablet a day for 7days then 1 a day and stay      . DISCONTD: predniSONE (DELTASONE) 10 MG tablet Take 4 for four days 3 for four days then 2 daily  100 tablet  6  . DISCONTD: predniSONE (DELTASONE) 10 MG tablet 2 daily       . varenicline (CHANTIX) 0.5 MG tablet Take 1 tablet (0.5 mg total) by mouth 2 (two) times daily.  60 tablet  2  . DISCONTD: varenicline (CHANTIX) 0.5 MG tablet Take 1 tablet (0.5 mg total) by mouth 2 (two) times daily.  60 tablet  2

## 2011-06-04 NOTE — Assessment & Plan Note (Signed)
Bilateral nodular mass like infiltrates centered around airways  PPD neg Fob 05/08/11: Bronch wash neg for fungi or AFB,  Aerobic c/s was not sent TBBX pos non caseating granulomas c/w sarcoidosis IMproved symptoms with steroids but pt still smoking Plan Slow pred taper Stop smoking with chantix Repeat CXR

## 2011-06-04 NOTE — Patient Instructions (Signed)
Start Chantix , follow instructions on the package to stop smoking  Reduce prednisone to 1 1/2 daily for 7days then reduce to 1 daily and stay Return 2 months [place]

## 2011-06-08 ENCOUNTER — Telehealth: Payer: Self-pay | Admitting: Critical Care Medicine

## 2011-06-08 NOTE — Progress Notes (Signed)
Quick Note:  Spoke with pt. I informed him CXR is markedly better per Dr. Delford Field and to stay on meds prescribed at last OV. He verbalized understanding of these results and recs and voiced no further questions/concerns at this time. ______

## 2011-06-08 NOTE — Telephone Encounter (Signed)
Notes Recorded by Shan Levans, MD on 06/04/2011 at 4:38 PM Call pt and tell him his CXR is markedly better Stay on meds as Rx this OV     Called, spoke with pt.  I informed him of above CXR results and recs per PW.  He verbalized understanding of this and voiced no further questions/concerns at this time.

## 2011-06-21 LAB — AFB CULTURE WITH SMEAR (NOT AT ARMC)

## 2011-08-06 ENCOUNTER — Encounter: Payer: Self-pay | Admitting: Critical Care Medicine

## 2011-08-06 ENCOUNTER — Ambulatory Visit (INDEPENDENT_AMBULATORY_CARE_PROVIDER_SITE_OTHER): Payer: Managed Care, Other (non HMO) | Admitting: Critical Care Medicine

## 2011-08-06 VITALS — BP 128/68 | HR 83 | Temp 98.1°F | Ht 68.0 in | Wt 219.0 lb

## 2011-08-06 DIAGNOSIS — D869 Sarcoidosis, unspecified: Secondary | ICD-10-CM

## 2011-08-06 DIAGNOSIS — D860 Sarcoidosis of lung: Secondary | ICD-10-CM

## 2011-08-06 DIAGNOSIS — F17200 Nicotine dependence, unspecified, uncomplicated: Secondary | ICD-10-CM

## 2011-08-06 DIAGNOSIS — J99 Respiratory disorders in diseases classified elsewhere: Secondary | ICD-10-CM

## 2011-08-06 MED ORDER — VARENICLINE TARTRATE 0.5 MG PO TABS: ORAL_TABLET | ORAL | Status: DC

## 2011-08-06 MED ORDER — PREDNISONE 10 MG PO TABS: ORAL_TABLET | ORAL | Status: DC

## 2011-08-06 NOTE — Patient Instructions (Signed)
Resume Chantix at 1/2 tablet twice daily Stay on prednisone 10mg  daily Return 2 months Quit smoking

## 2011-08-06 NOTE — Assessment & Plan Note (Signed)
Bilateral nodular mass like infiltrates centered around airways  PPD neg Fob 05/08/11: Bronch wash neg for fungi or AFB,  Aerobic c/s was not sent TBBX pos non caseating granulomas c/w sarcoidosis IMproved symptoms with steroids but pt still smoking CXR better at last ov Plan Prednisone at 10mg /d to stay Stop smoking with chantix at reduced dose, note had nausea on 2mg /d chantix

## 2011-08-06 NOTE — Progress Notes (Signed)
Subjective:    Patient ID: Cody Peterson, male    DOB: 12-11-1985, 25 y.o.   MRN: [id number]  HPI  25 y.o.AAM with pulmonary infiltrates here for f/u and outpt pulm establish  Pt ill with cough for several months, more dyspnea noted and pt presented to ED one week ago.  Pt seen by [facility] and had FOB 9/21.  Results are pending except AFB and fungal smears of bronch washings were neg.   Pt now with continued cough productive of yellow mucus.  No chest pain.  Pt had PPD neg.  No f/c/s.  Pt here for followup post bronch as Dr simonds does not come to [facility] .   08/06/2011 Notes dyspnea is better.  Sl cough ,  Notes occ chest pain around the heart.  No real wheeze.  No joint pain.  No skin issues.       Past Medical History  Diagnosis Date  . Asthma   . Cough   . Sarcoidosis      Family History  Problem Relation Age of Onset  . Tuberculosis Father   . Lung cancer Maternal Grandfather   . Colon cancer Maternal Uncle   . Stomach cancer Paternal Grandmother      History   Social History  . Marital Status: Married    Spouse Name: N/A    Number of Children: 1  . Years of Education: N/A   Occupational History  . [employer] Station    Social History Main Topics  . Smoking status: Current Some Day Smoker -- 0.1 packs/day    Types: Cigarettes  . Smokeless tobacco: Never Used   Comment: started smoking at age 33  . Alcohol Use: No  . Drug Use: No  . Sexually Active: No   Other Topics Concern  . Not on file   Social History Narrative  . No narrative on file     Allergies  Allergen Reactions  . Aspirin Nausea And Vomiting  . Bee Swelling  . Ivp Dye (Iodinated Diagnostic Agents) Swelling  . Shellfish Allergy Swelling     Outpatient Prescriptions Prior to Visit  Medication Sig Dispense Refill  . albuterol (PROVENTIL HFA;VENTOLIN HFA) 108 (90 BASE) MCG/ACT inhaler Inhale 2 puffs into the lungs every 6 (six) hours as needed.        . hydrocortisone 1 % lotion Apply  topically 2 (two) times daily as needed. For eczema       . predniSONE (DELTASONE) 10 MG tablet Reduce to 1 and 1/2 tablet a day for 7days then 1 a day and stay      . varenicline (CHANTIX) 0.5 MG tablet Take 1 tablet (0.5 mg total) by mouth 2 (two) times daily.  60 tablet  2      Review of Systems  Constitutional:   No  weight loss, night sweats,  Fevers, chills, fatigue, lassitude. HEENT:   No headaches,  Difficulty swallowing,  Tooth/dental problems,  Sore throat,                No sneezing, itching, ear ache, nasal congestion, post nasal drip,   CV:  No chest pain,  Orthopnea, PND, swelling in lower extremities, anasarca, dizziness, palpitations  GI  No heartburn, indigestion, abdominal pain, nausea, vomiting, diarrhea, change in bowel habits, loss of appetite  Resp: No  shortness of breath with exertion not  at rest.  No excess mucus, no productive cough,  No non-productive cough,  No  coughing up of blood  No change in color of mucus.  No wheezing.  No chest wall deformity  Skin: no rash or lesions.  GU: no dysuria, change in color of urine, no urgency or frequency.  No flank pain.  MS:  No joint pain or swelling.  No decreased range of motion.  No back pain.  Psych:  No change in mood or affect. No depression or anxiety.  No memory loss.     Objective:   Physical Exam  Filed Vitals:   08/06/11 1148  BP: 128/68  Pulse: 83  Temp: 98.1 F (36.7 C)  TempSrc: Oral  Height: 5\' 8"  (1.727 m)  Weight: 219 lb (99.338 kg)  SpO2: 98%    Gen: Pleasant, well-nourished, in no distress,  normal affect  ENT: No lesions,  mouth clear,  oropharynx clear, no postnasal drip  Neck: No JVD, no TMG, no carotid bruits  Lungs: No use of accessory muscles, no dullness to percussion, clear without rales or rhonchi  Cardiovascular: RRR, heart sounds normal, no murmur or gallops, no peripheral edema  Abdomen: soft and NT, no HSM,  BS normal  Musculoskeletal: No deformities, no  cyanosis or clubbing  Neuro: alert, non focal  Skin: Warm, no lesions or rashes  CT Chest 05/07/11 Findings: There are bilateral nodular masses, many of which are  greater than 3 cm, and several of which demonstrate early signs of  central cavitation. These nodules/masses are centered along  bronchioles and more confluent areas of consolidation for example  within the right middle lobe demonstrate air bronchograms. No  pleural effusion or pneumothorax. There are enlarged mediastinal  and hilar lymph nodes, with a right subcarinal lymph node measuring  greater than 1.6 cm short axis. A right infrahilar node measures  1.6 cm short axis.  No main branch pulmonary artery filling defect. The aorta is of  normal course and caliber. The heart size is within normal limits.  Limited images through the upper abdomen show no acute abnormality.  No acute or aggressive osseous abnormality.  IMPRESSION:  Confluent nodules/masses throughout all lobes, many of which  demonstrate air bronchograms. The pattern is unusual, though  favored to represent an endobronchial spread. Atypical and fungal  infections should be considered as well as inflammatory conditions  such as sarcoidosis, and lymphoma in the setting of mediastinal and  hilar lymphadenopathy. The pattern is not typical for hematogenous  metastases. Recommend pulmonary consultation and consider  bronchoscopy.       Assessment & Plan:   Sarcoidosis of lung Bilateral nodular mass like infiltrates centered around airways  PPD neg Fob 05/08/11: Bronch wash neg for fungi or AFB,  Aerobic c/s was not sent TBBX pos non caseating granulomas c/w sarcoidosis IMproved symptoms with steroids but pt still smoking CXR better at last ov Plan Prednisone at 10mg /d to stay Stop smoking with chantix at reduced dose, note had nausea on 2mg /d chantix      Updated Medication List Outpatient Encounter Prescriptions as of 08/06/2011  Medication Sig  Dispense Refill  . albuterol (PROVENTIL HFA;VENTOLIN HFA) 108 (90 BASE) MCG/ACT inhaler Inhale 2 puffs into the lungs every 6 (six) hours as needed.        . hydrocortisone 1 % lotion Apply topically 2 (two) times daily as needed. For eczema       . predniSONE (DELTASONE) 10 MG tablet One daily      . DISCONTD: predniSONE (DELTASONE) 10 MG tablet Reduce to 1 and 1/2 tablet a day for 7days then 1  a day and stay      . varenicline (CHANTIX) 0.5 MG tablet 1/2 tablet twice daily  60 tablet  2  . DISCONTD: varenicline (CHANTIX) 0.5 MG tablet Take 1 tablet (0.5 mg total) by mouth 2 (two) times daily.  60 tablet  2

## 2011-10-08 ENCOUNTER — Ambulatory Visit: Payer: Self-pay | Admitting: Critical Care Medicine

## 2011-10-15 ENCOUNTER — Encounter: Payer: Self-pay | Admitting: Critical Care Medicine

## 2011-10-15 ENCOUNTER — Ambulatory Visit (INDEPENDENT_AMBULATORY_CARE_PROVIDER_SITE_OTHER): Payer: Managed Care, Other (non HMO) | Admitting: Critical Care Medicine

## 2011-10-15 VITALS — BP 128/86 | HR 68 | Temp 98.3°F | Ht 68.0 in | Wt 240.0 lb

## 2011-10-15 DIAGNOSIS — D860 Sarcoidosis of lung: Secondary | ICD-10-CM

## 2011-10-15 DIAGNOSIS — D869 Sarcoidosis, unspecified: Secondary | ICD-10-CM

## 2011-10-15 DIAGNOSIS — F17200 Nicotine dependence, unspecified, uncomplicated: Secondary | ICD-10-CM

## 2011-10-15 DIAGNOSIS — J99 Respiratory disorders in diseases classified elsewhere: Secondary | ICD-10-CM

## 2011-10-15 MED ORDER — PREDNISONE 10 MG PO TABS: ORAL_TABLET | ORAL | Status: AC

## 2011-10-15 MED ORDER — VARENICLINE TARTRATE 1 MG PO TABS: ORAL_TABLET | ORAL | Status: DC

## 2011-10-15 NOTE — Assessment & Plan Note (Signed)
Ongoing smoking use Plan  d/c cigs with chantix 1mg  bid

## 2011-10-15 NOTE — Patient Instructions (Signed)
Resume Chantix 1mg  twice daily Reduce prednisone to 5 mg (1/2 10mg  tablet) daily Return 6 months ,sooner if having problems Work on getting off cigarettes

## 2011-10-15 NOTE — Assessment & Plan Note (Signed)
Bilateral nodular mass like infiltrates centered around airways  PPD neg Fob 05/08/11: Bronch wash neg for fungi or AFB,  Aerobic c/s was not sent TBBX pos non caseating granulomas c/w sarcoidosis Improved with steroids Plan Cont pred but reduce to 5mg /d Stop smoking with chantix 1mg  bid

## 2011-10-15 NOTE — Progress Notes (Signed)
Subjective:    Patient ID: Cody Peterson, male    DOB: 10-23-1985, 26 y.o.   MRN: [id number]  HPI  26 y.o.AAM with pulmonary infiltrates here for f/u and outpt pulm establish  Pt ill with cough for several months, more dyspnea noted and pt presented to ED one week ago.  Pt seen by [facility] and had FOB 9/21.  Results are pending except AFB and fungal smears of bronch washings were neg.   Pt now with continued cough productive of yellow mucus.  No chest pain.  Pt had PPD neg.  No f/c/s.  Pt here for followup post bronch as Dr simonds does not come to [facility] .   12/20 Notes dyspnea is better.  Sl cough ,  Notes occ chest pain around the heart.  No real wheeze.  No joint pain.  No skin issues.       10/15/2011 Tolerated lower dose of chantix , helped to stay off cigarettes.   On pred 10mg /d No real cough.  Dyspnea is better, only when works out    Past Medical History  Diagnosis Date  . Asthma   . Cough   . Sarcoidosis      Family History  Problem Relation Age of Onset  . Tuberculosis Father   . Lung cancer Maternal Grandfather   . Colon cancer Maternal Uncle   . Stomach cancer Paternal Grandmother      History   Social History  . Marital Status: Married    Spouse Name: N/A    Number of Children: 1  . Years of Education: N/A   Occupational History  . [employer]    Social History Main Topics  . Smoking status: Current Some Day Smoker -- 0.1 packs/day    Types: Cigarettes  . Smokeless tobacco: Never Used   Comment: started smoking at age 47  . Alcohol Use: No  . Drug Use: No  . Sexually Active: No   Other Topics Concern  . Not on file   Social History Narrative  . No narrative on file     Allergies  Allergen Reactions  . Aspirin Nausea And Vomiting  . Bee Swelling  . Ivp Dye (Iodinated Diagnostic Agents) Swelling  . Shellfish Allergy Swelling     Outpatient Prescriptions Prior to Visit  Medication Sig Dispense Refill  . albuterol (PROVENTIL  HFA;VENTOLIN HFA) 108 (90 BASE) MCG/ACT inhaler Inhale 2 puffs into the lungs every 6 (six) hours as needed.        . hydrocortisone 1 % lotion Apply topically 2 (two) times daily as needed. For eczema       . predniSONE (DELTASONE) 10 MG tablet One daily      . varenicline (CHANTIX) 0.5 MG tablet 1/2 tablet twice daily  60 tablet  2      Review of Systems  Constitutional:   No  weight loss, night sweats,  Fevers, chills, fatigue, lassitude. HEENT:   No headaches,  Difficulty swallowing,  Tooth/dental problems,  Sore throat,                No sneezing, itching, ear ache, nasal congestion, post nasal drip,   CV:  No chest pain,  Orthopnea, PND, swelling in lower extremities, anasarca, dizziness, palpitations  GI  No heartburn, indigestion, abdominal pain, nausea, vomiting, diarrhea, change in bowel habits, loss of appetite  Resp: No  shortness of breath with exertion not  at rest.  No excess mucus, no productive cough,  No  non-productive cough,  No  coughing up of blood  No change in color of mucus.  No wheezing.  No chest wall deformity  Skin: no rash or lesions.  GU: no dysuria, change in color of urine, no urgency or frequency.  No flank pain.  MS:  No joint pain or swelling.  No decreased range of motion.  No back pain.  Psych:  No change in mood or affect. No depression or anxiety.  No memory loss.     Objective:   Physical Exam  Filed Vitals:   10/15/11 1140  BP: 128/86  Pulse: 68  Temp: 98.3 F (36.8 C)  TempSrc: Oral  Height: 5\' 8"  (1.727 m)  Weight: 240 lb (108.863 kg)  SpO2: 99%    Gen: Pleasant, well-nourished, in no distress,  normal affect  ENT: No lesions,  mouth clear,  oropharynx clear, no postnasal drip  Neck: No JVD, no TMG, no carotid bruits  Lungs: No use of accessory muscles, no dullness to percussion, clear without rales or rhonchi  Cardiovascular: RRR, heart sounds normal, no murmur or gallops, no peripheral edema  Abdomen: soft and NT, no  HSM,  BS normal  Musculoskeletal: No deformities, no cyanosis or clubbing  Neuro: alert, non focal  Skin: Warm, no lesions or rashes  CT Chest 05/07/11 Findings: There are bilateral nodular masses, many of which are  greater than 3 cm, and several of which demonstrate early signs of  central cavitation. These nodules/masses are centered along  bronchioles and more confluent areas of consolidation for example  within the right middle lobe demonstrate air bronchograms. No  pleural effusion or pneumothorax. There are enlarged mediastinal  and hilar lymph nodes, with a right subcarinal lymph node measuring  greater than 1.6 cm short axis. A right infrahilar node measures  1.6 cm short axis.  No main branch pulmonary artery filling defect. The aorta is of  normal course and caliber. The heart size is within normal limits.  Limited images through the upper abdomen show no acute abnormality.  No acute or aggressive osseous abnormality.  IMPRESSION:  Confluent nodules/masses throughout all lobes, many of which  demonstrate air bronchograms. The pattern is unusual, though  favored to represent an endobronchial spread. Atypical and fungal  infections should be considered as well as inflammatory conditions  such as sarcoidosis, and lymphoma in the setting of mediastinal and  hilar lymphadenopathy. The pattern is not typical for hematogenous  metastases. Recommend pulmonary consultation and consider  bronchoscopy.       Assessment & Plan:   Sarcoidosis of lung Bilateral nodular mass like infiltrates centered around airways  PPD neg Fob 05/08/11: Bronch wash neg for fungi or AFB,  Aerobic c/s was not sent TBBX pos non caseating granulomas c/w sarcoidosis Improved with steroids Plan Cont pred but reduce to 5mg /d Stop smoking with chantix 1mg  bid   Nicotine addiction Ongoing smoking use Plan  d/c cigs with chantix 1mg  bid      Updated Medication List Outpatient Encounter  Prescriptions as of 10/15/2011  Medication Sig Dispense Refill  . albuterol (PROVENTIL HFA;VENTOLIN HFA) 108 (90 BASE) MCG/ACT inhaler Inhale 2 puffs into the lungs every 6 (six) hours as needed.        . hydrocortisone 1 % lotion Apply topically 2 (two) times daily as needed. For eczema       . predniSONE (DELTASONE) 10 MG tablet One- half tablet  daily  30 tablet  6  . varenicline (CHANTIX) 1 MG tablet  One twice daily, break in half if nausea occurs  60 tablet  2  . DISCONTD: predniSONE (DELTASONE) 10 MG tablet One daily      . DISCONTD: varenicline (CHANTIX) 0.5 MG tablet 1/2 tablet twice daily  60 tablet  2

## 2012-10-01 IMAGING — CT CT CHEST W/ CM
2 of 3 series · 15 of 36 positions shown, 18 images · IV contrast (APPLIED)
Comparison: 05/06/2011 radiograph

CLINICAL DATA: Cough, congestion

CT CHEST WITH CONTRAST
TECHNIQUE: Multidetector CT imaging of the chest was performed
following the standard protocol during bolus administration of
intravenous contrast.
Contrast: 80mL OMNIPAQUE IOHEXOL 300 MG/ML IV SOLN

[Series 2: chest 5.0 b31f · axial · 0.70mm/px · z∈[+1166,+1401]mm · 12 of 57 slices shown, 15 images]
[im 5/57  mediastinal]
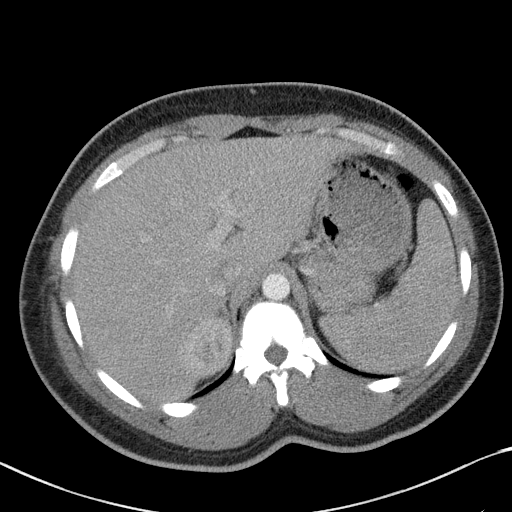
[im 5/57  lung]
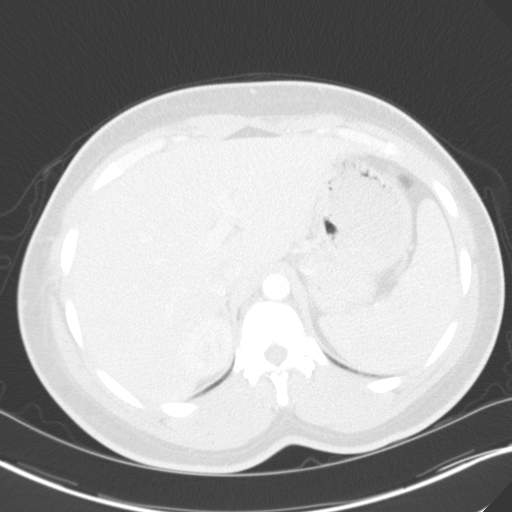
[im 9/57  lung]
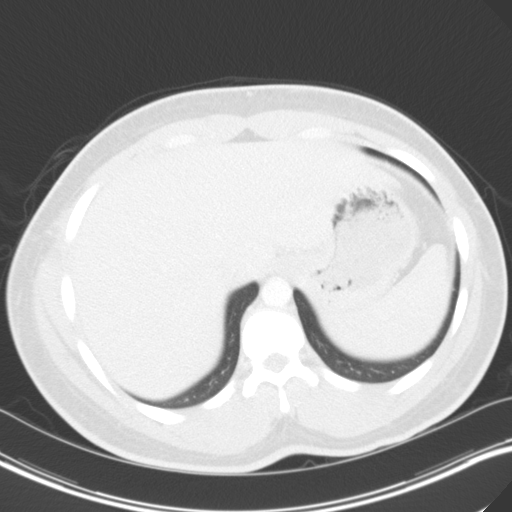
[im 13/57  lung]
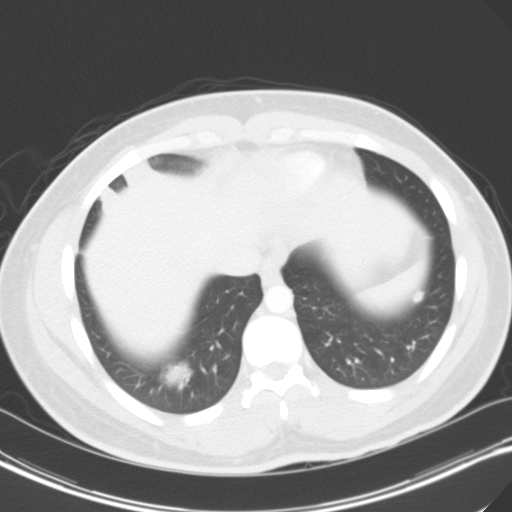
[im 17/57  lung]
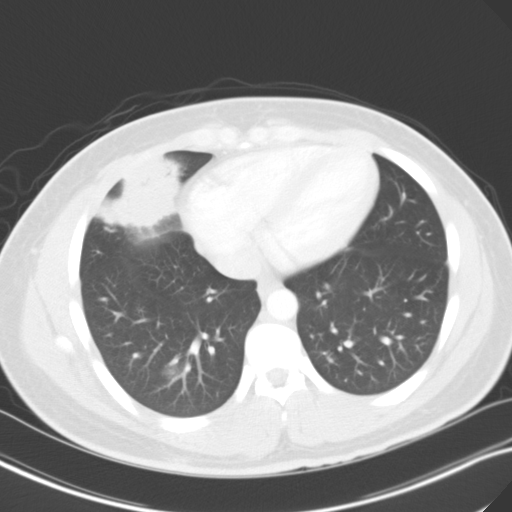
[im 21/57  mediastinal]
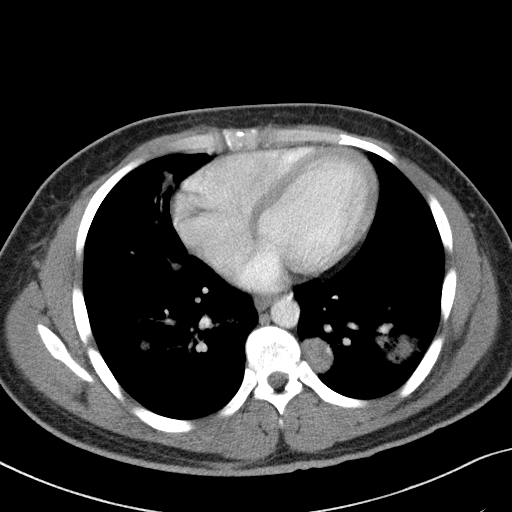
[im 21/57  lung]
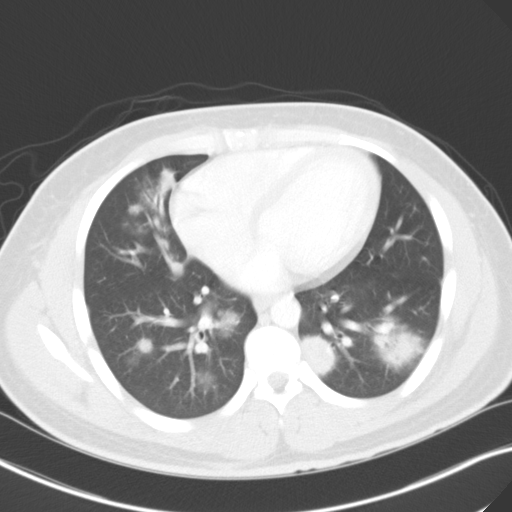
[im 25/57  lung]
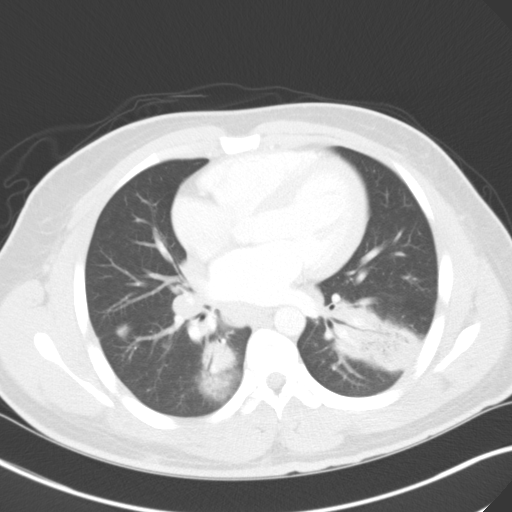
[im 32/57  lung]
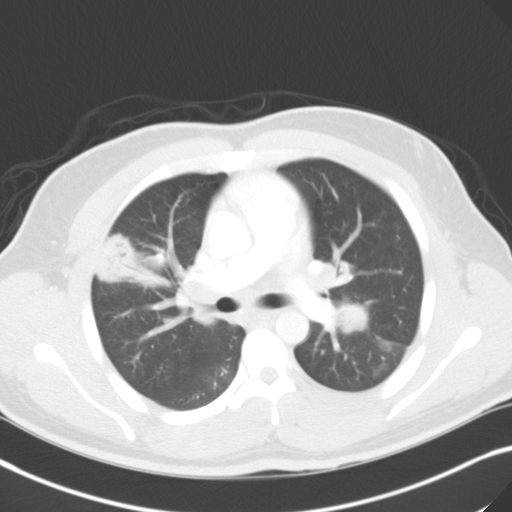
[im 36/57  lung]
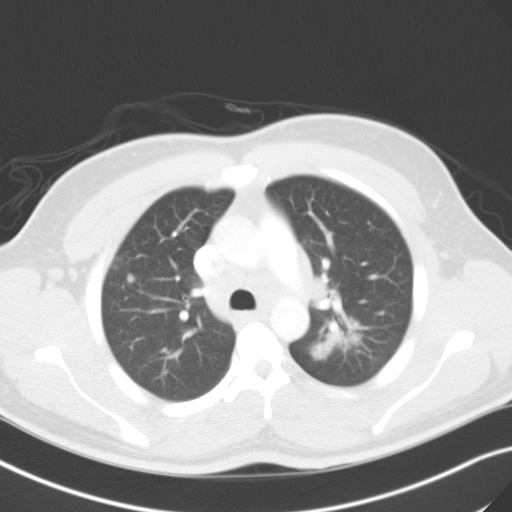
[im 40/57  mediastinal]
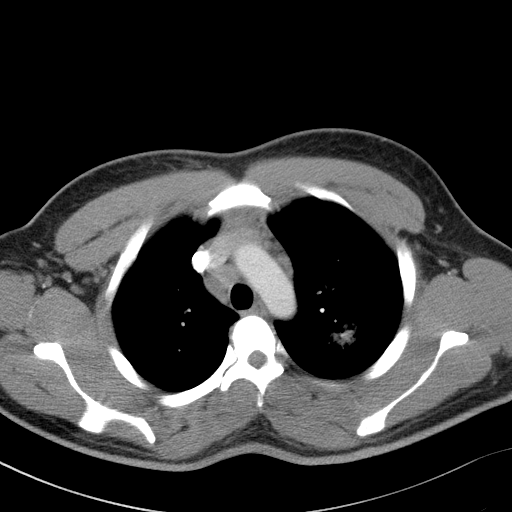
[im 40/57  lung]
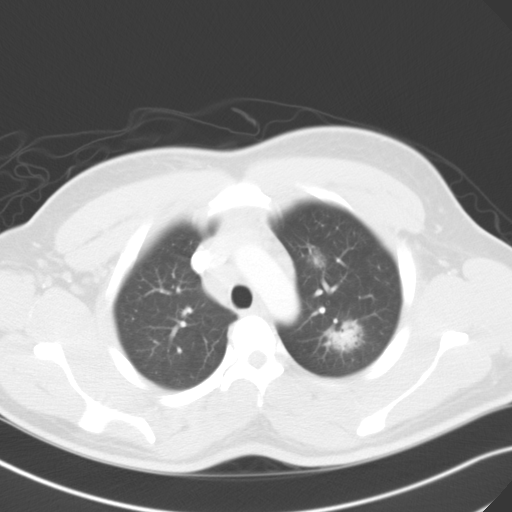
[im 44/57  lung]
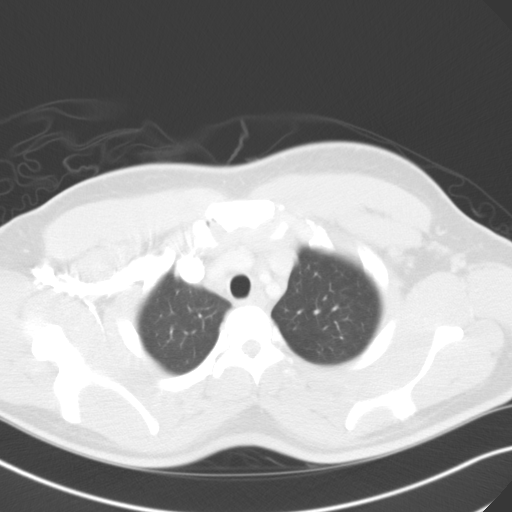
[im 48/57  lung]
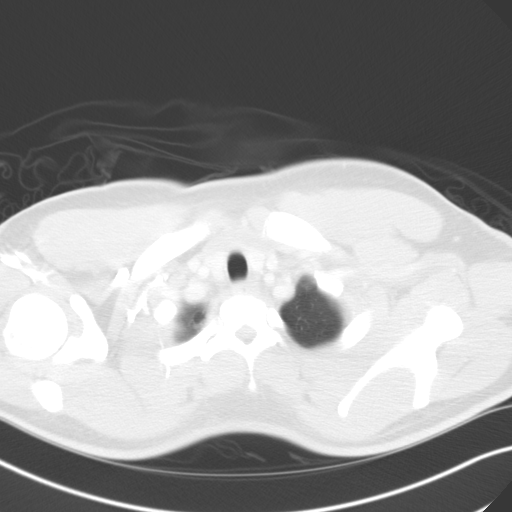
[im 52/57  lung]
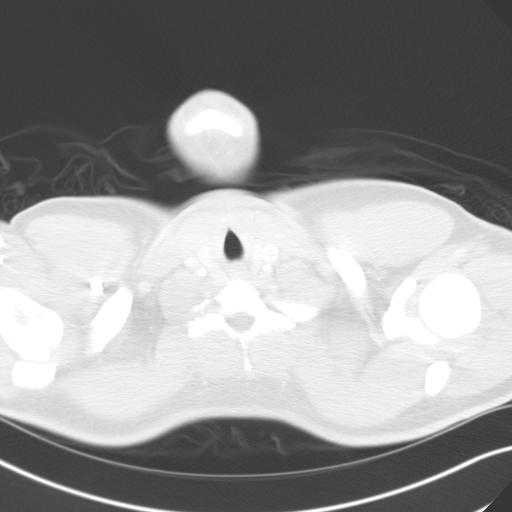

[Series 6: chest 3.0 coronal · coronal · 0.57mm/px · 3 of 85 slices shown]
[im 17/85  lung]
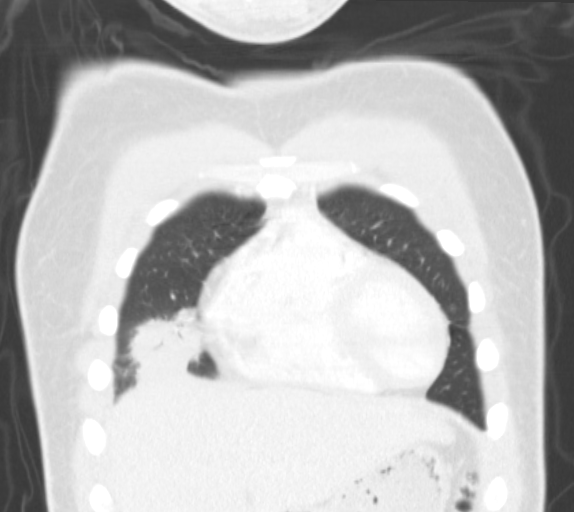
[im 34/85  lung]
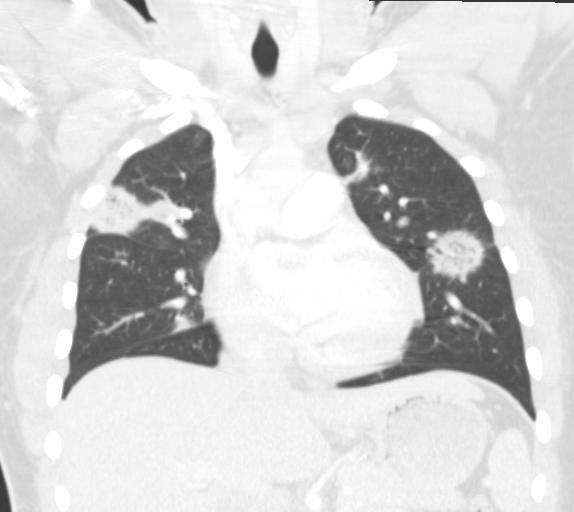
[im 51/85  lung]
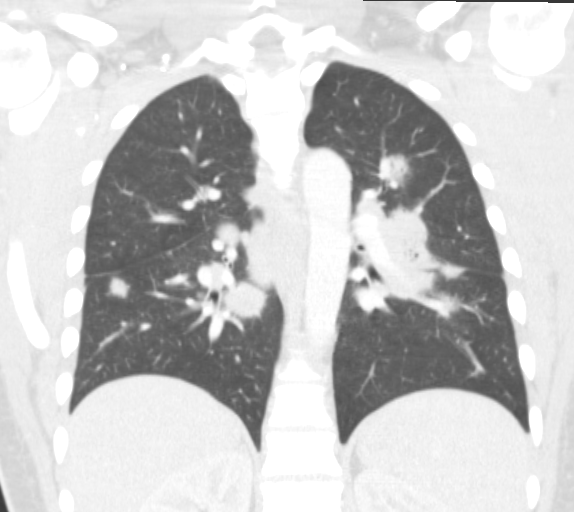

[15 of 36 positions shown; findings below may reference images not displayed]

FINDINGS: There are bilateral nodular masses, many of which are
greater than 3 cm, and several of which demonstrate early signs of
central cavitation. These nodules/masses are centered along
bronchioles and more confluent areas of consolidation for example
within the right middle lobe demonstrate air bronchograms.  No
pleural effusion or pneumothorax.  There are enlarged mediastinal
and hilar lymph nodes, with a right subcarinal lymph node measuring
greater than 1.6 cm short axis.  A right infrahilar node measures
1.6 cm short axis.

No main branch pulmonary artery filling defect.  The aorta is of
normal course and caliber.  The heart size is within normal limits.

Limited images through the upper abdomen show no acute abnormality.

No acute or aggressive osseous abnormality.
IMPRESSION: Confluent nodules/masses throughout all lobes, many of which
demonstrate air bronchograms.  The pattern is unusual, though
favored to represent an endobronchial  spread.  Atypical and fungal
infections should be considered as well as inflammatory conditions
such as sarcoidosis, and lymphoma in the setting of mediastinal and
hilar lymphadenopathy.  The pattern is not typical for hematogenous
metastases.  Recommend pulmonary consultation and consider
bronchoscopy.

 Discussed via telephone with Dr. Takako at [DATE] a.m. on
05/07/2011.

## 2012-10-02 IMAGING — CR DG CHEST 1V PORT
1 series · 1 of 1 positions shown · non-contrast
Comparison: 05/06/2011

CLINICAL DATA: Post bronchoscopy.

PORTABLE CHEST - 1 VIEW

[view not recorded]
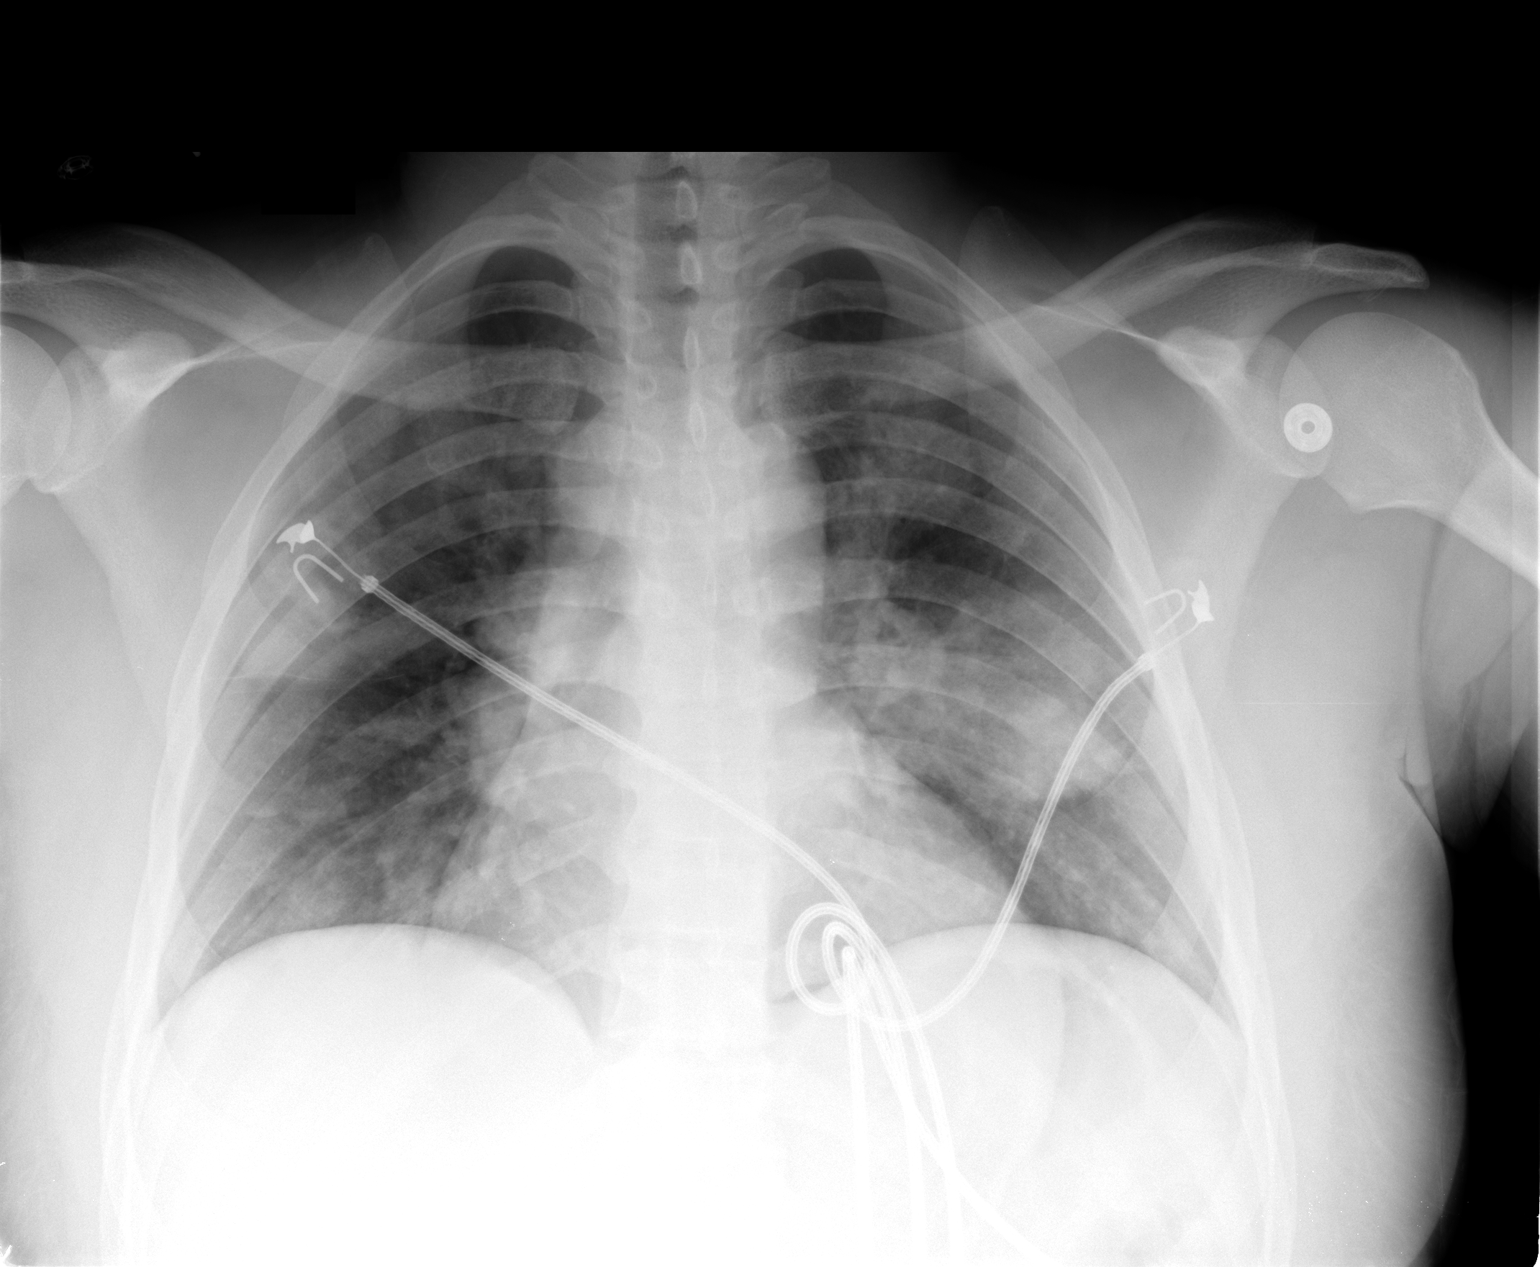

[1 of 1 positions shown; findings below may reference images not displayed]

FINDINGS: 2282 hours. The cardiopericardial silhouette is enlarged.
Lung volumes are low.  Numerous bilateral pulmonary nodules of
varying sizes are evident.  There is more vascular congestion today
than previously. Telemetry leads overlie the chest.
IMPRESSION: Low volume film and AP technique accentuates the cardiopericardial
silhouette.

No pneumothorax.

Multiple bilateral nodular opacities of varying sizes.

## 2014-11-28 ENCOUNTER — Encounter (HOSPITAL_BASED_OUTPATIENT_CLINIC_OR_DEPARTMENT_OTHER): Payer: Self-pay | Admitting: *Deleted

## 2014-11-28 ENCOUNTER — Emergency Department (HOSPITAL_BASED_OUTPATIENT_CLINIC_OR_DEPARTMENT_OTHER)
Admission: EM | Admit: 2014-11-28 | Discharge: 2014-11-28 | Discharge status: Against Medical Advice | Payer: BLUE CROSS/BLUE SHIELD | Attending: Emergency Medicine | Admitting: Emergency Medicine

## 2014-11-28 DIAGNOSIS — Z720 Tobacco use: Secondary | ICD-10-CM | POA: Diagnosis not present

## 2014-11-28 DIAGNOSIS — L02412 Cutaneous abscess of left axilla: Secondary | ICD-10-CM | POA: Insufficient documentation

## 2014-11-28 DIAGNOSIS — J45909 Unspecified asthma, uncomplicated: Secondary | ICD-10-CM | POA: Insufficient documentation

## 2014-11-28 NOTE — ED Notes (Signed)
Pt c/o left axillary abscess x 3 days bil arm rash x weeks

## 2014-11-30 ENCOUNTER — Emergency Department (HOSPITAL_BASED_OUTPATIENT_CLINIC_OR_DEPARTMENT_OTHER)
Admission: EM | Admit: 2014-11-30 | Discharge: 2014-11-30 | Disposition: A | Discharge status: Home / Self Care | Payer: BLUE CROSS/BLUE SHIELD | Attending: Emergency Medicine | Admitting: Emergency Medicine

## 2014-11-30 ENCOUNTER — Encounter (HOSPITAL_BASED_OUTPATIENT_CLINIC_OR_DEPARTMENT_OTHER): Payer: Self-pay

## 2014-11-30 DIAGNOSIS — Z79899 Other long term (current) drug therapy: Secondary | ICD-10-CM | POA: Diagnosis not present

## 2014-11-30 DIAGNOSIS — L02412 Cutaneous abscess of left axilla: Secondary | ICD-10-CM | POA: Diagnosis present

## 2014-11-30 DIAGNOSIS — Z720 Tobacco use: Secondary | ICD-10-CM | POA: Insufficient documentation

## 2014-11-30 DIAGNOSIS — J45909 Unspecified asthma, uncomplicated: Secondary | ICD-10-CM | POA: Insufficient documentation

## 2014-11-30 DIAGNOSIS — Z86018 Personal history of other benign neoplasm: Secondary | ICD-10-CM | POA: Diagnosis not present

## 2014-11-30 DIAGNOSIS — Z7952 Long term (current) use of systemic steroids: Secondary | ICD-10-CM | POA: Insufficient documentation

## 2014-11-30 MED ORDER — CLINDAMYCIN HCL 150 MG PO CAPS: 300.0000 mg | ORAL_CAPSULE | Freq: Three times a day (TID) | ORAL | Status: AC

## 2014-11-30 MED ORDER — HYDROCODONE-ACETAMINOPHEN 5-325 MG PO TABS: 2.0000 | ORAL_TABLET | ORAL | Status: AC | PRN

## 2014-11-30 MED ORDER — HYDROCODONE-ACETAMINOPHEN 5-325 MG PO TABS
2.0000 | ORAL_TABLET | Freq: Once | ORAL | Status: AC
  Administered 2014-11-30: 2 via ORAL
  Filled 2014-11-30: qty 2

## 2014-11-30 MED ORDER — CLINDAMYCIN HCL 150 MG PO CAPS
300.0000 mg | ORAL_CAPSULE | Freq: Once | ORAL | Status: AC
  Administered 2014-11-30: 300 mg via ORAL
  Filled 2014-11-30: qty 2

## 2014-11-30 NOTE — ED Notes (Signed)
Pt reports left axilla abscess x3 days - pain, drainage, swollen, red, denies nausea or fevers.

## 2014-11-30 NOTE — ED Notes (Signed)
Wound dressed. ?

## 2014-11-30 NOTE — ED Provider Notes (Signed)
CSN: [id number]     Arrival date & time 11/30/14  1811 History   First MD Initiated Contact with Patient 11/30/14 1958     Chief Complaint  Patient presents with  . Abscess     (Consider location/radiation/quality/duration/timing/severity/associated sxs/prior Treatment) Patient is a 29 y.o. male presenting with abscess. The history is provided by the patient. No language interpreter was used.  Abscess Location:  Shoulder/arm Shoulder/arm abscess location:  L axilla Size:  4x2cm Abscess quality: draining, fluctuance and painful   Red streaking: no   Duration:  3 days Progression:  Unchanged Pain details:    Quality:  Throbbing and pressure   Severity:  Severe   Duration:  3 days   Timing:  Constant   Progression:  Unchanged Chronicity:  New Context: not diabetes, not immunosuppression, not injected drug use, not insect bite/sting and not skin injury   Relieved by:  Nothing Worsened by:  Nothing tried Ineffective treatments:  None tried Associated symptoms: no anorexia, no fatigue, no fever, no headaches, no nausea and no vomiting   Risk factors: prior abscess   Risk factors: no family hx of MRSA and no hx of MRSA     Past Medical History  Diagnosis Date  . Asthma   . Cough   . Sarcoidosis    History reviewed. No pertinent past surgical history. Family History  Problem Relation Age of Onset  . Tuberculosis Father   . Lung cancer Maternal Grandfather   . Colon cancer Maternal Uncle   . Stomach cancer Paternal Grandmother    History  Substance Use Topics  . Smoking status: Current Some Day Smoker -- 0.10 packs/day    Types: Cigarettes  . Smokeless tobacco: Never Used     Comment: started smoking at age 29  . Alcohol Use: Yes     Comment: social    Review of Systems  Constitutional: Negative for fever and fatigue.  Gastrointestinal: Negative for nausea, vomiting and anorexia.  Skin: Positive for wound.  Neurological: Negative for headaches.  All other  systems reviewed and are negative.     Allergies  Aspirin; Ivp dye; Nutritional supplements; and Shellfish allergy  Home Medications   Prior to Admission medications   Medication Sig Start Date End Date Taking? Authorizing Provider  albuterol (PROVENTIL HFA;VENTOLIN HFA) 108 (90 BASE) MCG/ACT inhaler Inhale 2 puffs into the lungs every 6 (six) hours as needed.      Historical Provider, MD  hydrocortisone 1 % lotion Apply topically 2 (two) times daily as needed. For eczema     Historical Provider, MD  predniSONE (DELTASONE) 10 MG tablet One- half tablet  daily 10/15/11   Storm FriskPatrick E Wright, MD   BP 142/88 mmHg  Pulse 70  Temp(Src) 98.8 F (37.1 C) (Oral)  Resp 18  Ht 5\' 8"  (1.727 m)  Wt 210 lb (95.255 kg)  BMI 31.94 kg/m2  SpO2 98% Physical Exam  Constitutional: He is oriented to person, place, and time. He appears well-developed and well-nourished. No distress.  HENT:  Head: Normocephalic and atraumatic.  Eyes: Conjunctivae and EOM are normal.  Neck: Normal range of motion.  Cardiovascular: Normal rate and regular rhythm.  Exam reveals no gallop and no friction rub.   No murmur heard. Pulmonary/Chest: Effort normal and breath sounds normal. He has no wheezes. He has no rales. He exhibits no tenderness.  Abdominal: Soft. He exhibits no distension. There is no tenderness. There is no rebound.  Musculoskeletal: Normal range of motion.  Neurological: He  is alert and oriented to person, place, and time. Coordination normal.  Speech is goal-oriented. Moves limbs without ataxia.   Skin: Skin is warm and dry.  Fluctuant area of left axilla with purulent drainage noted.   Psychiatric: He has a normal mood and affect. His behavior is normal.  Nursing note and vitals reviewed.   ED Course  Procedures (including critical care time) Labs Review Labs Reviewed - No data to display  Imaging Review No results found.   EKG Interpretation None      MDM   Final diagnoses:   Abscess of left axilla    8:47 PM Patient has a draining abscess in the left axilla. Patient will have clindamycin and vicodin for pain. Vitals stable and patient afebrile.    Emilia Beck, PA-C 11/30/14 2110  Shon Baton, MD 12/01/14 [phone]

## 2014-11-30 NOTE — Discharge Instructions (Signed)
Take Clindamycin as directed until gone. Take Vicodin as needed for pain. Refer to attached documents for more information.

## 2016-01-20 ENCOUNTER — Encounter (HOSPITAL_BASED_OUTPATIENT_CLINIC_OR_DEPARTMENT_OTHER): Payer: Self-pay | Admitting: *Deleted

## 2016-01-20 ENCOUNTER — Emergency Department (HOSPITAL_BASED_OUTPATIENT_CLINIC_OR_DEPARTMENT_OTHER)
Admission: EM | Admit: 2016-01-20 | Discharge: 2016-01-20 | Disposition: A | Discharge status: Home / Self Care | Payer: BLUE CROSS/BLUE SHIELD | Attending: Emergency Medicine | Admitting: Emergency Medicine

## 2016-01-20 DIAGNOSIS — J45909 Unspecified asthma, uncomplicated: Secondary | ICD-10-CM | POA: Insufficient documentation

## 2016-01-20 DIAGNOSIS — M62830 Muscle spasm of back: Secondary | ICD-10-CM

## 2016-01-20 DIAGNOSIS — M545 Low back pain: Secondary | ICD-10-CM | POA: Diagnosis present

## 2016-01-20 DIAGNOSIS — F17210 Nicotine dependence, cigarettes, uncomplicated: Secondary | ICD-10-CM | POA: Insufficient documentation

## 2016-01-20 MED ORDER — ACETAMINOPHEN 500 MG PO TABS: 500.0000 mg | ORAL_TABLET | Freq: Four times a day (QID) | ORAL | Status: DC | PRN

## 2016-01-20 MED ORDER — CYCLOBENZAPRINE HCL 10 MG PO TABS: 10.0000 mg | ORAL_TABLET | Freq: Three times a day (TID) | ORAL | Status: AC | PRN

## 2016-01-20 MED ORDER — ACETAMINOPHEN 500 MG PO TABS: 500.0000 mg | ORAL_TABLET | Freq: Four times a day (QID) | ORAL | Status: AC | PRN

## 2016-01-20 MED ORDER — CYCLOBENZAPRINE HCL 10 MG PO TABS: 10.0000 mg | ORAL_TABLET | Freq: Three times a day (TID) | ORAL | Status: DC | PRN

## 2016-01-20 NOTE — Discharge Instructions (Signed)
Please follow up with your primary care physician within 1 week.  Take Tylenol as instructed. Take Flexeril as instructed. Massage, stretching exercises, and heating pad may also help with your back pain.    Muscle Cramps and Spasms Muscle cramps and spasms occur when a muscle or muscles tighten and you have no control over this tightening (involuntary muscle contraction). They are a common problem and can develop in any muscle. The most common place is in the calf muscles of the leg. Both muscle cramps and muscle spasms are involuntary muscle contractions, but they also have differences:   Muscle cramps are sporadic and painful. They may last a few seconds to a quarter of an hour. Muscle cramps are often more forceful and last longer than muscle spasms.  Muscle spasms may or may not be painful. They may also last just a few seconds or much longer. CAUSES  It is uncommon for cramps or spasms to be due to a serious underlying problem. In many cases, the cause of cramps or spasms is unknown. Some common causes are:   Overexertion.   Overuse from repetitive motions (doing the same thing over and over).   Remaining in a certain position for a long period of time.   Improper preparation, form, or technique while performing a sport or activity.   Dehydration.   Injury.   Side effects of some medicines.   Abnormally low levels of the salts and ions in your blood (electrolytes), especially potassium and calcium. This could happen if you are taking water pills (diuretics) or you are pregnant.  Some underlying medical problems can make it more likely to develop cramps or spasms. These include, but are not limited to:   Diabetes.   Parkinson disease.   Hormone disorders, such as thyroid problems.   Alcohol abuse.   Diseases specific to muscles, joints, and bones.   Blood vessel disease where not enough blood is getting to the muscles.  HOME CARE INSTRUCTIONS   Stay  well hydrated. Drink enough water and fluids to keep your urine clear or pale yellow.  It may be helpful to massage, stretch, and relax the affected muscle.  For tight or tense muscles, use a warm towel, heating pad, or hot shower water directed to the affected area.  If you are sore or have pain after a cramp or spasm, applying ice to the affected area may relieve discomfort.  Put ice in a plastic bag.  Place a towel between your skin and the bag.  Leave the ice on for 15-20 minutes, 03-04 times a day.  Medicines used to treat a known cause of cramps or spasms may help reduce their frequency or severity. Only take over-the-counter or prescription medicines as directed by your caregiver. SEEK MEDICAL CARE IF:  Your cramps or spasms get more severe, more frequent, or do not improve over time.  MAKE SURE YOU:   Understand these instructions.  Will watch your condition.  Will get help right away if you are not doing well or get worse.   This information is not intended to replace advice given to you by your health care provider. Make sure you discuss any questions you have with your health care provider.   Document Released: 01/23/2002 Document Revised: 11/28/2012 Document Reviewed: 07/20/2012 Elsevier Interactive Patient Education [employer]2016 Elsevier Inc.

## 2016-01-20 NOTE — ED Notes (Signed)
Left flank pain for a week after lifting something at work.

## 2016-01-20 NOTE — ED Provider Notes (Signed)
CSN: [id number]     Arrival date & time 01/20/16  1752 History   None    Chief Complaint  Patient presents with  . Flank Pain     (Consider location/radiation/quality/duration/timing/severity/associated sxs/prior Treatment) HPI  Patient is a 30 yo M with a PMHx of asthma and sarcoidosis presenting to the emergency room with a chief complaint of sharp pain in his L lower back which started 1 week ago. States he was lifting a heavy box at work. He works at [employer] and states his job requires him to lift heavy objects. States the pain resolved in a day and then came back yesterday. It is intermittent and "12/10" in severity. States the pain worse when bending over and lifting heavy objects but he is able to walk without any difficulty and the pain does not radiate to his legs. States he tried taking Advil but to availPatient denies any history of falls or back problems in the past. States he does not wear any brace/ belt at work when lifting heavy objects. Denies having any dysuria, fever, or chills. Denies having any numbness, weakness, or tingling in his lower extremities. Denies having any saddle anesthesia. Denies having urinary or fecal incontinence.   Past Medical History  Diagnosis Date  . Asthma   . Cough   . Sarcoidosis (HCC)    History reviewed. No pertinent past surgical history. Family History  Problem Relation Age of Onset  . Tuberculosis Father   . Lung cancer Maternal Grandfather   . Colon cancer Maternal Uncle   . Stomach cancer Paternal Grandmother    Social History  Substance Use Topics  . Smoking status: Current Some Day Smoker -- 0.10 packs/day    Types: Cigarettes  . Smokeless tobacco: Never Used     Comment: started smoking at age 30  . Alcohol Use: Yes     Comment: social    Review of Systems  Constitutional: Negative for fever, chills and fatigue.  HENT: Negative for congestion and sore throat.   Eyes: Negative for pain and visual disturbance.   Respiratory: Negative for cough, shortness of breath and wheezing.   Cardiovascular: Negative for chest pain and leg swelling.  Gastrointestinal: Negative for nausea, vomiting, abdominal pain, diarrhea and constipation.  Genitourinary: Negative for dysuria, flank pain and difficulty urinating.  Musculoskeletal: Positive for back pain. Negative for gait problem and neck pain.  Skin: Negative for rash and wound.  Neurological: Negative for weakness, numbness and headaches.      Allergies  Aspirin; Ivp dye; Nutritional supplements; and Shellfish allergy  Home Medications   Prior to Admission medications   Medication Sig Start Date End Date Taking? Authorizing Provider  albuterol (PROVENTIL HFA;VENTOLIN HFA) 108 (90 BASE) MCG/ACT inhaler Inhale 2 puffs into the lungs every 6 (six) hours as needed.      Historical Provider, MD  clindamycin (CLEOCIN) 150 MG capsule Take 2 capsules (300 mg total) by mouth 3 (three) times daily. May dispense as 150mg  capsules 11/30/14   Emilia BeckKaitlyn Szekalski, PA-C  HYDROcodone-acetaminophen (NORCO/VICODIN) 5-325 MG per tablet Take 2 tablets by mouth every 4 (four) hours as needed. 11/30/14   Kaitlyn Szekalski, PA-C  hydrocortisone 1 % lotion Apply topically 2 (two) times daily as needed. For eczema     Historical Provider, MD  predniSONE (DELTASONE) 10 MG tablet One- half tablet  daily 10/15/11   Storm FriskPatrick E Wright, MD   BP 120/79 mmHg  Pulse 68  Temp(Src) 98.4 F (36.9 C) (Oral)  Resp 18  Ht  (1.676 m)  Wt 106.595 kg  BMI 37.95 kg/m2  SpO2 100% Physical Exam  Constitutional: He is oriented to person, place, and time. He appears well-developed and well-nourished. No distress.  HENT:  Head: Normocephalic and atraumatic.  Mouth/Throat: Oropharynx is clear and moist.  Eyes: EOM are normal. Pupils are equal, round, and reactive to light.  Neck: Neck supple. No tracheal deviation present.  Cardiovascular: Normal rate, regular rhythm and intact distal pulses.   Exam reveals no gallop and no friction rub.   No murmur heard. Pulmonary/Chest: Effort normal and breath sounds normal. No respiratory distress. He has no wheezes. He has no rales.  Abdominal: Soft. Bowel sounds are normal. He exhibits no distension. There is no tenderness. There is no guarding.  Musculoskeletal: Normal range of motion. He exhibits no edema or tenderness.  No tenderness on palpation of spine or paraspinal muscles. Normal ROM of neck and spine.  Left sided lumbar paraspinal muscles feel tight on palpation.   Neurological: He is alert and oriented to person, place, and time.  Strength 5/5 and sensation to light touch intact in bilateral lower extremities. Straight leg raise test negative bilaterally.   Skin: Skin is warm and dry. No rash noted. No erythema.    ED Course  Procedures (including critical care time) Labs Review Labs Reviewed  URINALYSIS, ROUTINE W REFLEX MICROSCOPIC (NOT AT [facility])    Imaging Review No results found. I have personally reviewed and evaluated these images and lab results as part of my medical decision-making.   EKG Interpretation None      MDM   Final diagnoses:  None   Back muscle spasms Patient is presenting with muscle spasms of the Left lumbar paraspinal muscles in the setting of lifting a heavy object about a week ago. No indication for imaging at this time because he does not have any alarm symptoms such as focal weakness, numbness, saddle anesthesia, or urinary/ fecal incontinence. Strength 5/5 and sensation intact intact. No pain on palpation of the spine or paparspinal muscles. Normal ROM of back and neck. Straight leg raise test negative. Back pain not likely renal in etiology because patient does not have any dysuria, fevers, or chills.  -Flexeril 10 mg tid prn  -Tylenol 500 mg q6 prn -Massage, stretching exercises, heating pad -Patient has been advised to follow up with his PCP -Advised to return to the emergency room or  seek immediate medical attention if he experiences any alarm symptoms (mentioned above)      John Giovanni, MD 01/20/16 1938  Marily Memos, MD 01/21/16 1736

## 2020-07-11 ENCOUNTER — Emergency Department (HOSPITAL_BASED_OUTPATIENT_CLINIC_OR_DEPARTMENT_OTHER)
Admission: EM | Admit: 2020-07-11 | Discharge: 2020-07-11 | Disposition: A | Discharge status: Home / Self Care | Payer: BLUE CROSS/BLUE SHIELD | Attending: Emergency Medicine | Admitting: Emergency Medicine

## 2020-07-11 ENCOUNTER — Encounter (HOSPITAL_BASED_OUTPATIENT_CLINIC_OR_DEPARTMENT_OTHER): Payer: Self-pay | Admitting: *Deleted

## 2020-07-11 ENCOUNTER — Other Ambulatory Visit: Payer: Self-pay

## 2020-07-11 DIAGNOSIS — H209 Unspecified iridocyclitis: Secondary | ICD-10-CM

## 2020-07-11 DIAGNOSIS — W208XXA Other cause of strike by thrown, projected or falling object, initial encounter: Secondary | ICD-10-CM | POA: Insufficient documentation

## 2020-07-11 DIAGNOSIS — H5319 Other subjective visual disturbances: Secondary | ICD-10-CM | POA: Insufficient documentation

## 2020-07-11 DIAGNOSIS — F17210 Nicotine dependence, cigarettes, uncomplicated: Secondary | ICD-10-CM | POA: Insufficient documentation

## 2020-07-11 DIAGNOSIS — H20011 Primary iridocyclitis, right eye: Secondary | ICD-10-CM | POA: Insufficient documentation

## 2020-07-11 DIAGNOSIS — J45909 Unspecified asthma, uncomplicated: Secondary | ICD-10-CM | POA: Insufficient documentation

## 2020-07-11 DIAGNOSIS — S0501XA Injury of conjunctiva and corneal abrasion without foreign body, right eye, initial encounter: Secondary | ICD-10-CM | POA: Insufficient documentation

## 2020-07-11 MED ORDER — TOBRAMYCIN-DEXAMETHASONE 0.3-0.1 % OP SUSP
2.0000 [drp] | Freq: Four times a day (QID) | OPHTHALMIC | Status: DC
  Administered 2020-07-11: 2 [drp] via OPHTHALMIC
  Filled 2020-07-11: qty 2.5

## 2020-07-11 MED ORDER — ATROPINE SULFATE 1 % OP SOLN
1.0000 [drp] | Freq: Once | OPHTHALMIC | Status: DC
  Filled 2020-07-11: qty 2

## 2020-07-11 MED ORDER — TETRACAINE HCL 0.5 % OP SOLN
1.0000 [drp] | Freq: Once | OPHTHALMIC | Status: AC
  Administered 2020-07-11: 1 [drp] via OPHTHALMIC
  Filled 2020-07-11: qty 4

## 2020-07-11 MED ORDER — FLUORESCEIN SODIUM 1 MG OP STRP
1.0000 | ORAL_STRIP | Freq: Once | OPHTHALMIC | Status: AC
  Administered 2020-07-11: 1 via OPHTHALMIC
  Filled 2020-07-11: qty 1
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
## 2020-12-18 ENCOUNTER — Emergency Department (HOSPITAL_BASED_OUTPATIENT_CLINIC_OR_DEPARTMENT_OTHER)
Admission: EM | Admit: 2020-12-18 | Discharge: 2020-12-18 | Disposition: A | Discharge status: Home / Self Care | Payer: BLUE CROSS/BLUE SHIELD | Attending: Emergency Medicine | Admitting: Emergency Medicine

## 2020-12-18 ENCOUNTER — Other Ambulatory Visit: Payer: Self-pay

## 2020-12-18 ENCOUNTER — Encounter (HOSPITAL_BASED_OUTPATIENT_CLINIC_OR_DEPARTMENT_OTHER): Payer: Self-pay

## 2020-12-18 DIAGNOSIS — J45909 Unspecified asthma, uncomplicated: Secondary | ICD-10-CM | POA: Insufficient documentation

## 2020-12-18 DIAGNOSIS — J069 Acute upper respiratory infection, unspecified: Secondary | ICD-10-CM

## 2020-12-18 DIAGNOSIS — Z20822 Contact with and (suspected) exposure to covid-19: Secondary | ICD-10-CM | POA: Diagnosis not present

## 2020-12-18 DIAGNOSIS — I10 Essential (primary) hypertension: Secondary | ICD-10-CM | POA: Diagnosis not present

## 2020-12-18 DIAGNOSIS — F17210 Nicotine dependence, cigarettes, uncomplicated: Secondary | ICD-10-CM | POA: Insufficient documentation

## 2020-12-18 DIAGNOSIS — R059 Cough, unspecified: Secondary | ICD-10-CM | POA: Insufficient documentation

## 2020-12-18 LAB — RESP PANEL BY RT-PCR (FLU A&B, COVID) ARPGX2
Influenza A by PCR: NEGATIVE
Influenza B by PCR: NEGATIVE
SARS Coronavirus 2 by RT PCR: NEGATIVE

## 2020-12-18 NOTE — ED Triage Notes (Signed)
Pt c/o flu like sx started yesterday-NAD-steady gait 

## 2020-12-18 NOTE — Discharge Instructions (Signed)
You appear to have an upper respiratory infection (URI). An upper respiratory tract infection, or cold, is a viral infection of the air passages leading to the lungs. It is contagious and can be spread to others, especially during the first 3 or 4 days. It cannot be cured by antibiotics or other medicines. °RETURN IMMEDIATELY IF you develop shortness of breath, confusion or altered mental status, a new rash, become dizzy, faint, or poorly responsive, or are unable to be cared for at home. ° °

## 2020-12-18 NOTE — ED Provider Notes (Signed)
[facility] EMERGENCY DEPARTMENT Provider Note   CSN: [id number] Arrival date & time: 12/18/20  2010     History Chief Complaint  Patient presents with  . Cough    Cody Peterson is a 35 y.o. male with a past medical history of sarcoidosis, asthma, cough.  Patient states that he woke up with a sore throat 2 days ago.  This progressed into a cough.  Yesterday he ran a high fever.  He had shakes, chills and body aches.  He denies any wheezing or shortness of breath, nausea vomiting or diarrhea.  He has no known contacts with similar symptoms.  HPI     Past Medical History:  Diagnosis Date  . Asthma   . Cough   . Sarcoidosis     Patient Active Problem List   Diagnosis Date Noted  . Nicotine addiction 06/04/2011  . 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 No pertinent surgical history.     Family History  Problem Relation Age of Onset  . Tuberculosis Father   . Lung cancer Maternal Grandfather   . Colon cancer Maternal Uncle   . Stomach cancer Paternal Grandmother     Social History   Tobacco Use  . Smoking status: Current Some Day Smoker    Packs/day: 0.10    Types: Cigarettes  . Smokeless tobacco: Never Used  . Tobacco comment: started smoking at age 72  Substance Use Topics  . Alcohol use: Yes    Comment: social  . Drug use: Yes    Types: Marijuana    Home Medications Prior to Admission medications   Medication Sig Start Date End Date Taking? Authorizing Provider  clindamycin (CLEOCIN) 150 MG capsule Take 2 capsules (300 mg total) by mouth 3 (three) times daily. May dispense as 150mg  capsules 11/30/14  Yes Szekalski, Kaitlyn, PA-C  cyclobenzaprine (FLEXERIL) 10 MG tablet Take 1 tablet (10 mg total) by mouth 3 (three) times daily as needed for muscle spasms. 01/20/16  Yes 03/21/16, MD  HYDROcodone-acetaminophen (NORCO/VICODIN) 5-325 MG per tablet Take 2 tablets by mouth every 4 (four) hours as needed. 11/30/14  Yes  Szekalski, Kaitlyn, PA-C  hydrocortisone 1 % lotion Apply topically 2 (two) times daily as needed. For eczema   Yes [provider]  predniSONE (DELTASONE) 10 MG tablet One- half tablet  daily 10/15/11  Yes 10/17/11, MD  acetaminophen (TYLENOL) 500 MG tablet Take 1 tablet (500 mg total) by mouth every 6 (six) hours as needed for mild pain or moderate pain. 01/20/16   03/21/16, MD  albuterol (PROVENTIL HFA;VENTOLIN HFA) 108 (90 BASE) MCG/ACT inhaler Inhale 2 puffs into the lungs every 6 (six) hours as needed.    [provider]    Allergies    Aspirin, Ivp dye [iodinated diagnostic agents], Nutritional supplements, and Shellfish allergy  Review of Systems   Review of Systems Ten systems reviewed and are negative for acute change, except as noted in the HPI.   Physical Exam Updated Vital Signs BP (!) 142/102 (BP Location: Left Arm)   Pulse 64   Temp 98.5 F (36.9 C) (Oral)   Resp 20   SpO2 98%   Physical Exam Vitals and nursing note reviewed.  Constitutional:      General: He is not in acute distress.    Appearance: He is well-developed. He is not diaphoretic.  HENT:     Head: Normocephalic and atraumatic.     Right Ear: Tympanic membrane  normal.     Left Ear: Tympanic membrane normal.     Mouth/Throat:     Mouth: Mucous membranes are moist.     Pharynx: No oropharyngeal exudate or posterior oropharyngeal erythema.  Eyes:     General: No scleral icterus.    Conjunctiva/sclera: Conjunctivae normal.  Cardiovascular:     Rate and Rhythm: Normal rate and regular rhythm.     Heart sounds: Normal heart sounds.  Pulmonary:     Effort: Pulmonary effort is normal. No respiratory distress.     Breath sounds: Normal breath sounds. No wheezing.  Abdominal:     Palpations: Abdomen is soft.     Tenderness: There is no abdominal tenderness.  Musculoskeletal:     Cervical back: Normal range of motion and neck supple.  Skin:    General: Skin is warm  and dry.  Neurological:     Mental Status: He is alert.  Psychiatric:        Behavior: Behavior normal.     ED Results / Procedures / Treatments   Labs (all labs ordered are listed, but only abnormal results are displayed) Labs Reviewed  RESP PANEL BY RT-PCR (FLU A&B, COVID) [id number]    EKG None  Radiology No results found.  Procedures Procedures   Medications Ordered in ED Medications - No data to display  ED Course  I have reviewed the triage vital signs and the nursing notes.   35 year old male here with URI symptoms.  He is otherwise hemodynamically stable with mild hypertension.  Advised to follow-up in the outpatient setting for the same.  Patient has been tested for COVID and flu.  He may follow-up on these in the outpatient setting.  COVID results are pending.  He appears otherwise appropriate for discharge this time  Pertinent labs & imaging results that were available during my care of the patient were reviewed by me and considered in my medical decision making (see chart for details).    MDM Rules/Calculators/A&P                           Final Clinical Impression(s) / ED Diagnoses Final diagnoses:  None    Rx / DC Orders ED Discharge Orders    None       Arthor Captain, PA-C 12/18/20 2210    Virgina Norfolk, DO 12/18/20 2305

## 2021-04-01 ENCOUNTER — Encounter (HOSPITAL_BASED_OUTPATIENT_CLINIC_OR_DEPARTMENT_OTHER): Payer: Self-pay | Admitting: *Deleted

## 2021-04-01 ENCOUNTER — Emergency Department (HOSPITAL_BASED_OUTPATIENT_CLINIC_OR_DEPARTMENT_OTHER)
Admission: EM | Admit: 2021-04-01 | Discharge: 2021-04-01 | Disposition: A | Discharge status: Home / Self Care | Payer: BLUE CROSS/BLUE SHIELD | Attending: Emergency Medicine | Admitting: Emergency Medicine

## 2021-04-01 ENCOUNTER — Other Ambulatory Visit: Payer: Self-pay

## 2021-04-01 DIAGNOSIS — Z87891 Personal history of nicotine dependence: Secondary | ICD-10-CM | POA: Diagnosis not present

## 2021-04-01 DIAGNOSIS — J45909 Unspecified asthma, uncomplicated: Secondary | ICD-10-CM | POA: Diagnosis not present

## 2021-04-01 DIAGNOSIS — R109 Unspecified abdominal pain: Secondary | ICD-10-CM | POA: Insufficient documentation

## 2021-04-01 DIAGNOSIS — R197 Diarrhea, unspecified: Secondary | ICD-10-CM | POA: Insufficient documentation

## 2021-04-01 NOTE — ED Triage Notes (Signed)
C/o diarrhea and abd pain yesterday , better today , needs a work note

## 2021-04-01 NOTE — Discharge Instructions (Addendum)
It was a pleasure taking care of you today.  As discussed, since all of your symptoms have resolved you may return to work tomorrow.  Return to the ER for new or worsening symptoms.

## 2021-04-01 NOTE — ED Provider Notes (Signed)
[facility] EMERGENCY DEPARTMENT Provider Note   CSN: [id number] Arrival date & time: 04/01/21  1320     History Chief Complaint  Patient presents with   Diarrhea    Work note    Emani Morad is a 35 y.o. male with a past medical history significant for asthma who presents to the ED due to abdominal pain and diarrhea that occurred yesterday.  Patient states he was unable to go to work yesterday and is requesting a work note.  No fever or chills.  Denies sore throat, cough, nasal congestion.  No sick contacts or known COVID exposures.  History obtained from patient and past medical records. No interpreter used during encounter.       Past Medical History:  Diagnosis Date   Asthma    Cough     Patient Active Problem List   Diagnosis Date Noted   Pulmonary infiltrate 05/11/2011    History reviewed. No pertinent surgical history.     Family History  Problem Relation Age of Onset   Tuberculosis Father    Lung cancer Maternal Grandfather    Colon cancer Maternal Uncle    Stomach cancer Paternal Grandmother     Social History   Tobacco Use   Smoking status: Former    Packs/day: 1.00    Years: 10.00    Pack years: 10.00    Types: Cigarettes    Quit date: 05/06/2011    Years since quitting: 9.9   Smokeless tobacco: Never  Substance Use Topics   Drug use: Yes    Types: Marijuana    Home Medications Prior to Admission medications   Not on File    Allergies    Aspirin  Review of Systems   Review of Systems  Constitutional:  Negative for chills and fever.  Gastrointestinal:  Positive for abdominal pain and diarrhea. Negative for nausea and vomiting.   Physical Exam Updated Vital Signs BP 140/89   Pulse 61   Temp 98.2 F (36.8 C) (Oral)   Resp 16   Ht 5\' 6"  (1.676 m)   Wt 93 kg   SpO2 99%   BMI 33.09 kg/m   Physical Exam Vitals and nursing note reviewed.  Constitutional:      General: He is not in acute distress.    Appearance: He  is not ill-appearing.  HENT:     Head: Normocephalic.  Eyes:     Pupils: Pupils are equal, round, and reactive to light.  Cardiovascular:     Rate and Rhythm: Normal rate and regular rhythm.     Pulses: Normal pulses.     Heart sounds: Normal heart sounds. No murmur heard.   No friction rub. No gallop.  Pulmonary:     Effort: Pulmonary effort is normal.     Breath sounds: Normal breath sounds.  Abdominal:     General: Abdomen is flat. There is no distension.     Palpations: Abdomen is soft.     Tenderness: There is no abdominal tenderness. There is no guarding or rebound.  Musculoskeletal:        General: Normal range of motion.     Cervical back: Neck supple.  Skin:    General: Skin is warm and dry.  Neurological:     General: No focal deficit present.     Mental Status: He is alert.  Psychiatric:        Mood and Affect: Mood normal.        Behavior: Behavior normal.  ED Results / Procedures / Treatments   Labs (all labs ordered are listed, but only abnormal results are displayed) Labs Reviewed - No data to display  EKG None  Radiology No results found.  Procedures Procedures  Medications Ordered in ED Medications - No data to display  ED Course  I have reviewed the triage vital signs and the nursing notes.  Pertinent labs & imaging results that were available during my care of the patient were reviewed by me and considered in my medical decision making (see chart for details).    MDM Rules/Calculators/A&P                           35 year old male presents to the ED requesting a work note after missing work yesterday due to abdominal pain and diarrhea.  No fever or chills.  No further episodes of diarrhea or abdominal pain.  Patient denies sore throat, nasal congestion, and cough.  Upon arrival vitals all within normal limits.  Patient nontoxic-appearing.  Abdomen soft, nondistended, nontender.  Low special for acute abdomen.  Work note provided. Strict  ED precautions discussed with patient. Patient states understanding and agrees to plan. Patient discharged home in no acute distress and stable vitals  Final Clinical Impression(s) / ED Diagnoses Final diagnoses:  Diarrhea, unspecified type    Rx / DC Orders ED Discharge Orders     None        Jesusita Oka 04/01/21 1441    Benjiman Core, MD 04/02/21 1353

## 2021-04-22 ENCOUNTER — Emergency Department (HOSPITAL_BASED_OUTPATIENT_CLINIC_OR_DEPARTMENT_OTHER)
Admission: EM | Admit: 2021-04-22 | Discharge: 2021-04-22 | Disposition: A | Discharge status: Home / Self Care | Payer: BLUE CROSS/BLUE SHIELD | Attending: Emergency Medicine | Admitting: Emergency Medicine

## 2021-04-22 ENCOUNTER — Encounter (HOSPITAL_BASED_OUTPATIENT_CLINIC_OR_DEPARTMENT_OTHER): Payer: Self-pay | Admitting: Urology

## 2021-04-22 ENCOUNTER — Emergency Department (HOSPITAL_BASED_OUTPATIENT_CLINIC_OR_DEPARTMENT_OTHER): Payer: BLUE CROSS/BLUE SHIELD

## 2021-04-22 ENCOUNTER — Other Ambulatory Visit: Payer: Self-pay

## 2021-04-22 DIAGNOSIS — M25561 Pain in right knee: Secondary | ICD-10-CM | POA: Insufficient documentation

## 2021-04-22 DIAGNOSIS — J45909 Unspecified asthma, uncomplicated: Secondary | ICD-10-CM | POA: Insufficient documentation

## 2021-04-22 DIAGNOSIS — Y92410 Unspecified street and highway as the place of occurrence of the external cause: Secondary | ICD-10-CM | POA: Insufficient documentation

## 2021-04-22 DIAGNOSIS — F17210 Nicotine dependence, cigarettes, uncomplicated: Secondary | ICD-10-CM | POA: Insufficient documentation

## 2021-04-22 DIAGNOSIS — M7918 Myalgia, other site: Secondary | ICD-10-CM

## 2021-04-22 MED ORDER — LIDOCAINE 5 % EX PTCH: 1.0000 | MEDICATED_PATCH | CUTANEOUS | 0 refills | Status: DC

## 2021-04-22 MED ORDER — METHOCARBAMOL 500 MG PO TABS: 500.0000 mg | ORAL_TABLET | Freq: Two times a day (BID) | ORAL | 0 refills | Status: DC

## 2021-04-22 MED ORDER — NAPROXEN 500 MG PO TABS: 500.0000 mg | ORAL_TABLET | Freq: Two times a day (BID) | ORAL | 0 refills | Status: DC
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
## 2021-06-09 ENCOUNTER — Encounter (HOSPITAL_BASED_OUTPATIENT_CLINIC_OR_DEPARTMENT_OTHER): Payer: Self-pay

## 2021-06-09 ENCOUNTER — Other Ambulatory Visit: Payer: Self-pay

## 2021-06-09 ENCOUNTER — Emergency Department (HOSPITAL_BASED_OUTPATIENT_CLINIC_OR_DEPARTMENT_OTHER)
Admission: EM | Admit: 2021-06-09 | Discharge: 2021-06-09 | Disposition: A | Discharge status: Home / Self Care | Payer: BLUE CROSS/BLUE SHIELD | Attending: Emergency Medicine | Admitting: Emergency Medicine

## 2021-06-09 DIAGNOSIS — R0981 Nasal congestion: Secondary | ICD-10-CM | POA: Diagnosis present

## 2021-06-09 DIAGNOSIS — F17210 Nicotine dependence, cigarettes, uncomplicated: Secondary | ICD-10-CM | POA: Diagnosis not present

## 2021-06-09 DIAGNOSIS — J069 Acute upper respiratory infection, unspecified: Secondary | ICD-10-CM | POA: Insufficient documentation

## 2021-06-09 DIAGNOSIS — Z20822 Contact with and (suspected) exposure to covid-19: Secondary | ICD-10-CM | POA: Insufficient documentation

## 2021-06-09 DIAGNOSIS — J45909 Unspecified asthma, uncomplicated: Secondary | ICD-10-CM | POA: Insufficient documentation

## 2021-06-09 LAB — RESP PANEL BY RT-PCR (FLU A&B, COVID) ARPGX2
Influenza A by PCR: NEGATIVE
Influenza B by PCR: NEGATIVE
SARS Coronavirus 2 by RT PCR: NEGATIVE

## 2021-06-09 NOTE — Discharge Instructions (Addendum)
You seem to have a fair amount of nasal congestion and recommend that you use Mucinex in place of theraflu. You can use tylenol, ibuprofen as needed for any fever, body aches. I encourage rest, fluids.  Please return if your symptoms worsen including new chest pain, shortness of breath.  Follow-up on the results of your COVID, flu swab on your portal later today.

## 2021-06-09 NOTE — ED Provider Notes (Signed)
[facility] EMERGENCY DEPARTMENT Provider Note   CSN: [id number] Arrival date & time: 06/09/21  1251     History Chief Complaint  Patient presents with   Nasal Congestion    Cody Peterson is a 35 y.o. male with a PMH sx for asthma as a child resolved with age, no longer using inhaler.  Patient also has history of cigarette smoking, and is now presenting with 2 days of nasal congestion, runny nose, dry cough without headache, nausea, vomiting, fever, body aches.  Patient is vaccinated against COVID, patient has not received his flu vaccine.  Patient has not tested himself at home for COVID or flu.  Patient denies any chest pain, shortness of breath.  HPI     Past Medical History:  Diagnosis Date   Asthma    Cough    Sarcoidosis     Patient Active Problem List   Diagnosis Date Noted   Nicotine addiction 06/04/2011   Sarcoidosis of lung (HCC) 05/11/2011    History reviewed. No pertinent surgical history.     Family History  Problem Relation Age of Onset   Tuberculosis Father    Lung cancer Maternal Grandfather    Colon cancer Maternal Uncle    Stomach cancer Paternal Grandmother     Social History   Tobacco Use   Smoking status: Some Days    Packs/day: 0.10    Types: Cigarettes   Smokeless tobacco: Never   Tobacco comments:    started smoking at age 9  Substance Use Topics   Alcohol use: Yes    Comment: social   Drug use: Yes    Types: Marijuana    Home Medications Prior to Admission medications   Medication Sig Start Date End Date Taking? Authorizing Provider  acetaminophen (TYLENOL) 500 MG tablet Take 1 tablet (500 mg total) by mouth every 6 (six) hours as needed for mild pain or moderate pain. 01/20/16   John Giovanni, MD  albuterol (PROVENTIL HFA;VENTOLIN HFA) 108 (90 BASE) MCG/ACT inhaler Inhale 2 puffs into the lungs every 6 (six) hours as needed.    [provider]  clindamycin (CLEOCIN) 150 MG capsule Take 2 capsules  (300 mg total) by mouth 3 (three) times daily. May dispense as 150mg  capsules 11/30/14   Szekalski, Kaitlyn, PA-C  cyclobenzaprine (FLEXERIL) 10 MG tablet Take 1 tablet (10 mg total) by mouth 3 (three) times daily as needed for muscle spasms. 01/20/16   03/21/16, MD  HYDROcodone-acetaminophen (NORCO/VICODIN) 5-325 MG per tablet Take 2 tablets by mouth every 4 (four) hours as needed. 11/30/14   12/02/14, PA-C  hydrocortisone 1 % lotion Apply topically 2 (two) times daily as needed. For eczema    [provider]  predniSONE (DELTASONE) 10 MG tablet One- half tablet  daily 10/15/11   10/17/11, MD    Allergies    Aspirin, Ivp dye [iodinated diagnostic agents], Nutritional supplements, and Shellfish allergy  Review of Systems   Review of Systems  HENT:  Positive for sinus pressure.   Respiratory:  Positive for cough. Negative for chest tightness and shortness of breath.   Cardiovascular:  Negative for chest pain.  All other systems reviewed and are negative.  Physical Exam Updated Vital Signs BP (!) 138/100 (BP Location: Right Arm)   Pulse 72   Temp 98.3 F (36.8 C) (Oral)   Resp 18   Ht 5\' 7"  (1.702 m)   Wt 95.7 kg   SpO2 100%   BMI 33.05  kg/m   Physical Exam Vitals and nursing note reviewed.  Constitutional:      General: He is not in acute distress.    Appearance: Normal appearance.  HENT:     Head: Normocephalic and atraumatic.     Comments: No tenderness to palpation over frontal, maxillary sinuses.  Oropharynx clear, tonsils 1+, without exudate, erythema, uvula midline, no peritonsillar abscess.    Right Ear: Tympanic membrane normal.     Left Ear: Tympanic membrane normal.     Nose: Congestion and rhinorrhea present.     Mouth/Throat:     Mouth: Mucous membranes are moist.     Pharynx: No oropharyngeal exudate.  Eyes:     General:        Right eye: No discharge.        Left eye: No discharge.  Cardiovascular:     Rate and Rhythm:  Normal rate and regular rhythm.  Pulmonary:     Effort: Pulmonary effort is normal. No respiratory distress.  Musculoskeletal:        General: No deformity.     Cervical back: Neck supple. No rigidity.  Lymphadenopathy:     Cervical: No cervical adenopathy.  Skin:    General: Skin is warm and dry.  Neurological:     Mental Status: He is alert and oriented to person, place, and time.  Psychiatric:        Mood and Affect: Mood normal.        Behavior: Behavior normal.    ED Results / Procedures / Treatments   Labs (all labs ordered are listed, but only abnormal results are displayed) Labs Reviewed  RESP PANEL BY RT-PCR (FLU A&B, COVID) [id number]    EKG None  Radiology No results found.  Procedures Procedures   Medications Ordered in ED Medications - No data to display  ED Course  I have reviewed the triage vital signs and the nursing notes.  Pertinent labs & imaging results that were available during my care of the patient were reviewed by me and considered in my medical decision making (see chart for details).    MDM Rules/Calculators/A&P                         Overall well-appearing patient with stable vital signs, signs of nasal congestion, normal-appearing oropharynx without tonsillar swelling, erythema, exudate, uvula midline.  Patient with signs of early viral sinusitis versus viral URI.  No fever.  No chest pain, shortness of breath.  We will perform COVID, flu swab, encouraged Mucinex, Flonase for congestion in place of TheraFlu.  Patient discharged in stable condition, return precautions given. Final Clinical Impression(s) / ED Diagnoses Final diagnoses:  Nasal congestion  Upper respiratory tract infection, unspecified type    Rx / DC Orders ED Discharge Orders     None        [place] 06/09/21 1549    Arby Barrette, MD 06/11/21 1626

## 2021-06-09 NOTE — ED Triage Notes (Signed)
Pt c/o congestion and cough since yesterday. Denies chest pain/SOB

## 2021-11-23 ENCOUNTER — Other Ambulatory Visit: Payer: Self-pay

## 2021-11-23 ENCOUNTER — Emergency Department (HOSPITAL_BASED_OUTPATIENT_CLINIC_OR_DEPARTMENT_OTHER)
Admission: EM | Admit: 2021-11-23 | Discharge: 2021-11-23 | Disposition: A | Discharge status: Home / Self Care | Payer: BLUE CROSS/BLUE SHIELD | Attending: Emergency Medicine | Admitting: Emergency Medicine

## 2021-11-23 ENCOUNTER — Emergency Department (HOSPITAL_BASED_OUTPATIENT_CLINIC_OR_DEPARTMENT_OTHER): Payer: BLUE CROSS/BLUE SHIELD | Admitting: Radiology

## 2021-11-23 ENCOUNTER — Encounter (HOSPITAL_BASED_OUTPATIENT_CLINIC_OR_DEPARTMENT_OTHER): Payer: Self-pay | Admitting: Emergency Medicine

## 2021-11-23 ENCOUNTER — Emergency Department (HOSPITAL_BASED_OUTPATIENT_CLINIC_OR_DEPARTMENT_OTHER): Payer: BLUE CROSS/BLUE SHIELD

## 2021-11-23 DIAGNOSIS — D649 Anemia, unspecified: Secondary | ICD-10-CM | POA: Insufficient documentation

## 2021-11-23 DIAGNOSIS — J45909 Unspecified asthma, uncomplicated: Secondary | ICD-10-CM | POA: Insufficient documentation

## 2021-11-23 DIAGNOSIS — M7989 Other specified soft tissue disorders: Secondary | ICD-10-CM | POA: Diagnosis not present

## 2021-11-23 DIAGNOSIS — M79601 Pain in right arm: Secondary | ICD-10-CM | POA: Diagnosis not present

## 2021-11-23 DIAGNOSIS — Z7952 Long term (current) use of systemic steroids: Secondary | ICD-10-CM | POA: Diagnosis not present

## 2021-11-23 DIAGNOSIS — M79631 Pain in right forearm: Secondary | ICD-10-CM | POA: Diagnosis not present

## 2021-11-23 DIAGNOSIS — R197 Diarrhea, unspecified: Secondary | ICD-10-CM | POA: Insufficient documentation

## 2021-11-23 LAB — CBC WITH DIFFERENTIAL/PLATELET
Abs Immature Granulocytes: 0.01 10*3/uL (ref 0.00–0.07)
Basophils Absolute: 0.1 10*3/uL (ref 0.0–0.1)
Basophils Relative: 1 %
Eosinophils Absolute: 0.3 10*3/uL (ref 0.0–0.5)
Eosinophils Relative: 5 %
HCT: 35.4 % — ABNORMAL LOW (ref 39.0–52.0)
Hemoglobin: 12.9 g/dL — ABNORMAL LOW (ref 13.0–17.0)
Immature Granulocytes: 0 %
Lymphocytes Relative: 29 %
Lymphs Abs: 1.8 10*3/uL (ref 0.7–4.0)
MCH: 29.9 pg (ref 26.0–34.0)
MCHC: 36.4 g/dL — ABNORMAL HIGH (ref 30.0–36.0)
MCV: 81.9 fL (ref 80.0–100.0)
Monocytes Absolute: 0.7 10*3/uL (ref 0.1–1.0)
Monocytes Relative: 11 %
Neutro Abs: 3.3 10*3/uL (ref 1.7–7.7)
Neutrophils Relative %: 54 %
Platelets: 263 10*3/uL (ref 150–400)
RBC: 4.32 MIL/uL (ref 4.22–5.81)
RDW: 13.8 % (ref 11.5–15.5)
WBC: 6.2 10*3/uL (ref 4.0–10.5)
nRBC: 0 % (ref 0.0–0.2)

## 2021-11-23 LAB — BASIC METABOLIC PANEL
Anion gap: 7 (ref 5–15)
BUN: 12 mg/dL (ref 6–20)
CO2: 26 mmol/L (ref 22–32)
Calcium: 9 mg/dL (ref 8.9–10.3)
Chloride: 107 mmol/L (ref 98–111)
Creatinine, Ser: 1.11 mg/dL (ref 0.61–1.24)
GFR, Estimated: >60 mL/min (ref >60)
Glucose, Bld: 103 mg/dL — ABNORMAL HIGH (ref 70–99)
Potassium: 3.6 mmol/L (ref 3.5–5.1)
Sodium: 140 mmol/L (ref 135–145)

## 2021-11-23 MED ORDER — SODIUM CHLORIDE 0.9 % IV BOLUS
1000.0000 mL | Freq: Once | INTRAVENOUS | Status: AC
  Administered 2021-11-23: 1000 mL via INTRAVENOUS

## 2021-11-23 MED ORDER — LOPERAMIDE HCL 2 MG PO CAPS
4.0000 mg | ORAL_CAPSULE | Freq: Once | ORAL | Status: AC
  Administered 2021-11-23: 4 mg via ORAL
  Filled 2021-11-23: qty 2

## 2021-11-23 MED ORDER — LOPERAMIDE HCL 2 MG PO CAPS: 2.0000 mg | ORAL_CAPSULE | Freq: Four times a day (QID) | ORAL | 0 refills | Status: AC | PRN

## 2021-11-23 NOTE — ED Triage Notes (Addendum)
Diarrhea since yesterday, about 10 stools. No n/v, abd cramping. Also injured right FA at work on Thursday and would like to get it checked  ?

## 2021-11-23 NOTE — Discharge Instructions (Addendum)
I have prescribed you Loperamide. You should only take this as needed. You had a 4 mg dose here in our ED. You can take 2 mg after any further diarrhea episode. Only take if you are having larger amounts of continued diarrhea to avoid getting constipated.  ?If you start developing concerning symptoms such as fevers, worsening abdominal pain, bloody diarrhea, or continued symptoms for up to 5 days, please return to be evaluated.  ? ?Right arm xray was negative for fracture. Recommend ice and rest as needed. ?

## 2021-11-23 NOTE — ED Provider Notes (Signed)
?Cody Peterson ?Provider Note ? ? ?CSN: [id number] ?Arrival date & time: 11/23/21  0908 ? ?  ? ?History ?PMH: Sarcoidosis, Asthma ? ?Chief Complaint  ?Patient presents with  ? Diarrhea  ? ? ?Cody Peterson is a 36 y.o. male.  Presents the emergency Peterson with a chief complaint of right arm pain and diarrhea. ? ?He injured his right arm at work on Thursday night.  He states he was loading the truck when he slid his arm between the dock plate and his arm ended up getting pit in it.  He has an associated superficial laceration.  He has been able to move his right wrist and elbow.  He denies any numbness. ? ?He also complains of diarrhea.  He said the diarrhea started yesterday morning.  Diarrhea is described as clear and watery.  It is worse with eating or drinking.  He has associated lower abdominal cramping that is present right before using the bathroom.  He states that yesterday he is the bathroom at least 10 times and small to moderate amounts.  He is used the bathroom 3 times since 830 today.  He denies any nausea, vomiting, fevers, weakness, fatigue.  He denies any new foods.  Nobody else has been sick.  No recent travel.  No recent antibiotics use. ? ? ?Diarrhea ?Associated symptoms: abdominal pain and arthralgias   ?Associated symptoms: no fever and no vomiting   ? ?  ? ?Home Medications ?Prior to Admission medications   ?Medication Sig Start Date End Date Taking? Authorizing Provider  ?loperamide (IMODIUM) 2 MG capsule Take 1 capsule (2 mg total) by mouth 4 (four) times daily as needed for up to 5 doses for diarrhea or loose stools. 11/23/21  Yes Jasalyn Frysinger, Adora Fridge, PA-C  ?acetaminophen (TYLENOL) 500 MG tablet Take 1 tablet (500 mg total) by mouth every 6 (six) hours as needed for mild pain or moderate pain. 01/20/16   Shela Leff, MD  ?albuterol (PROVENTIL HFA;VENTOLIN HFA) 108 (90 BASE) MCG/ACT inhaler Inhale 2 puffs into the lungs every 6 (six) hours as needed.     [provider]  ?clindamycin (CLEOCIN) 150 MG capsule Take 2 capsules (300 mg total) by mouth 3 (three) times daily. May dispense as 150mg  capsules 11/30/14   Alvina Chou, PA-C  ?cyclobenzaprine (FLEXERIL) 10 MG tablet Take 1 tablet (10 mg total) by mouth 3 (three) times daily as needed for muscle spasms. 01/20/16   Shela Leff, MD  ?HYDROcodone-acetaminophen (NORCO/VICODIN) 5-325 MG per tablet Take 2 tablets by mouth every 4 (four) hours as needed. 11/30/14   Alvina Chou, PA-C  ?hydrocortisone 1 % lotion Apply topically 2 (two) times daily as needed. For eczema    [provider]  ?predniSONE (DELTASONE) 10 MG tablet One- half tablet  daily 10/15/11   Elsie Stain, MD  ?   ? ?Allergies    ?Aspirin, Ivp dye [iodinated contrast media], Nutritional supplements, and Shellfish allergy   ? ?Review of Systems   ?Review of Systems  ?Constitutional:  Negative for fatigue and fever.  ?Gastrointestinal:  Positive for abdominal pain and diarrhea. Negative for abdominal distention, blood in stool, nausea and vomiting.  ?Musculoskeletal:  Positive for arthralgias.  ?Neurological:  Negative for weakness and numbness.  ?All other systems reviewed and are negative. ? ?Physical Exam ?Updated Vital Signs ?BP (!) 147/89   Pulse (!) 54   Temp 97.8 ?F (36.6 ?C)   Resp 16   Ht 5\' 7"  (1.702 m)  Wt 93.4 kg   SpO2 100%   BMI 32.26 kg/m?  ?Physical Exam ?Vitals and nursing note reviewed.  ?Constitutional:   ?   General: He is not in acute distress. ?   Appearance: Normal appearance. He is well-developed. He is not ill-appearing, toxic-appearing or diaphoretic.  ?HENT:  ?   Head: Normocephalic and atraumatic.  ?   Nose: No nasal deformity.  ?   Mouth/Throat:  ?   Lips: Pink. No lesions.  ?Eyes:  ?   General: Gaze aligned appropriately. No scleral icterus.    ?   Right eye: No discharge.     ?   Left eye: No discharge.  ?   Conjunctiva/sclera: Conjunctivae normal.  ?   Right eye: Right  conjunctiva is not injected. No exudate or hemorrhage. ?   Left eye: Left conjunctiva is not injected. No exudate or hemorrhage. ?Pulmonary:  ?   Effort: Pulmonary effort is normal. No respiratory distress.  ?Abdominal:  ?   General: Abdomen is flat. There is no distension.  ?   Palpations: Abdomen is soft. There is no mass.  ?   Tenderness: There is no abdominal tenderness. There is no right CVA tenderness, left CVA tenderness, guarding or rebound.  ?   Hernia: No hernia is present.  ?Musculoskeletal:  ?   Comments: Right arm with no obvious deformities or swelling. There is a superficial already healed over laceration to the right forearm that is approximately 5 cm. This is approximated well and not gaping. He is able to range both right elbow and wrist without difficulty. Tenderness is noted over the right anterior forearm. Radial pulse 2 +. Sensation intact distally.  ?Skin: ?   General: Skin is warm and dry.  ?Neurological:  ?   Mental Status: He is alert and oriented to person, place, and time.  ?Psychiatric:     ?   Mood and Affect: Mood normal.     ?   Speech: Speech normal.     ?   Behavior: Behavior normal. Behavior is cooperative.  ? ? ?ED Results / Procedures / Treatments   ?Labs ?(all labs ordered are listed, but only abnormal results are displayed) ?Labs Reviewed  ?BASIC METABOLIC PANEL - Abnormal; Notable for the following components:  ?    Result Value  ? Glucose, Bld 103 (*)   ? All other components within normal limits  ?CBC WITH DIFFERENTIAL/PLATELET - Abnormal; Notable for the following components:  ? Hemoglobin 12.9 (*)   ? HCT 35.4 (*)   ? MCHC 36.4 (*)   ? All other components within normal limits  ?URINALYSIS, ROUTINE W REFLEX MICROSCOPIC  ? ? ?EKG ?None ? ?Radiology ?DG Forearm Right ? ?Result Date: 11/23/2021 ?CLINICAL DATA:  Injury 4 days ago.  Right forearm pain. EXAM: RIGHT FOREARM - 2 VIEW COMPARISON:  None. FINDINGS: No fracture or bone lesion. Elbow and wrist joints are normally spaced  and aligned. Mild distal forearm and wrist soft tissue edema. IMPRESSION: No fracture or dislocation. Electronically Signed   By: Lajean Manes M.D.   On: 11/23/2021 10:11   ? ?Procedures ?Procedures  ? ? ?Medications Ordered in ED ?Medications  ?loperamide (IMODIUM) capsule 4 mg (4 mg Oral Given 11/23/21 0934)  ?sodium chloride 0.9 % bolus 1,000 mL (1,000 mLs Intravenous New Bag/Given 11/23/21 0948)  ? ? ?ED Course/ Medical Decision Making/ A&P ?  ? ?                        ?  Medical Decision Making ?Amount and/or Complexity of Data Reviewed ?Labs: ordered. ?Radiology: ordered. ? ?Risk ?Prescription drug management. ? ? ? ?MDM  ?This is a 36 y.o. male who presents to the ED with 24 hours of acute diarrhea and right arm pain. ?The differential of this patient includes but is not limited to Infectious diarrhea (Viral, bacterial, parasitic, toxic, travelers, C diff), Medication side effect, Hyperthyroidism, Diverticulitis, Appendicitis ? ?My Impression, Plan, and ED Course: Patient appears well and has normal vitals.  I doubt that this is a toxic diarrhea.  No risk factors for C. difficile.  He is not on any medications to explain this being a side effect.  No other symptoms to suggest thyroid problem.  Abdominal exam is benign, so low likelihood for diverticulitis or appendicitis. Does not seem to be food borne. Likely viral. Labs, IVF, Loperamide provided.  ? ?Right forearm plain films ordered. ? ?I personally ordered, reviewed, and interpreted all laboratory work and imaging and agree with radiologist interpretation. Results interpreted below:  ?CBC with mild anemia. Hgb 12.9. Last value was 10 years ago. I doubt this has anything to do with acute blood loss. Fairly minimal, so can f/u with PCP regarding anemia. No leukocytosis. ?BMP without electrolyte or kidney dysfunction. ?Forearm XR: Mild soft tissue swelling. No fracture.  ? ?After reviewing labs, do not think patient requires further management or care. Recommend  supportive tx at home for likely viral diarrhea. Return precautions provided. ? ?Charting Requirements ?Additional history is obtained from:  Independent historian ?External Records from outside source obtained and reviewed

## 2021-12-30 ENCOUNTER — Encounter (HOSPITAL_BASED_OUTPATIENT_CLINIC_OR_DEPARTMENT_OTHER): Payer: Self-pay

## 2021-12-30 ENCOUNTER — Emergency Department (HOSPITAL_BASED_OUTPATIENT_CLINIC_OR_DEPARTMENT_OTHER)
Admission: EM | Admit: 2021-12-30 | Discharge: 2021-12-30 | Disposition: A | Discharge status: Home / Self Care | Payer: BLUE CROSS/BLUE SHIELD | Attending: Emergency Medicine | Admitting: Emergency Medicine

## 2021-12-30 DIAGNOSIS — M545 Low back pain, unspecified: Secondary | ICD-10-CM | POA: Insufficient documentation

## 2021-12-30 MED ORDER — LIDOCAINE 5 % EX PTCH: 1.0000 | MEDICATED_PATCH | CUTANEOUS | 0 refills | Status: AC

## 2021-12-30 MED ORDER — DICLOFENAC SODIUM 1 % EX GEL: 4.0000 g | Freq: Four times a day (QID) | CUTANEOUS | 0 refills | Status: AC

## 2021-12-30 MED ORDER — METHOCARBAMOL 500 MG PO TABS: 500.0000 mg | ORAL_TABLET | Freq: Two times a day (BID) | ORAL | 0 refills | Status: DC

## 2021-12-30 MED ORDER — ACETAMINOPHEN 500 MG PO TABS: 1000.0000 mg | ORAL_TABLET | Freq: Four times a day (QID) | ORAL | 0 refills | Status: AC | PRN

## 2021-12-30 MED ORDER — MELOXICAM 7.5 MG PO TABS: 7.5000 mg | ORAL_TABLET | Freq: Every day | ORAL | 0 refills | Status: DC

## 2021-12-30 NOTE — Discharge Instructions (Signed)

## 2021-12-30 NOTE — ED Provider Notes (Signed)
?[facility] EMERGENCY DEPARTMENT ?Provider Note ? ? ?CSN: [id number] ?Arrival date & time: 12/30/21  1743 ? ?  ? ?History ? ?Chief Complaint  ?Patient presents with  ? Back Pain  ? ? ?Cody Peterson is a 36 y.o. male. ? ? ?Back Pain ? 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  He states that he feels "locked up " ? ?No bowel or bladder incontinence.  He states that hurts all the way across his low back no trauma to his back.  Denies any frequency urgency dysuria hematuria.  No other associate symptoms.  No numbness or weakness in leg ? ? ?  ? ?Home Medications ?Prior to Admission medications   ?Medication Sig Start Date End Date Taking? Authorizing Provider  ?acetaminophen (TYLENOL) 500 MG tablet Take 2 tablets (1,000 mg total) by mouth every 6 (six) hours as needed. 12/30/21  Yes Solon AugustaFondaw, Sherle Mello S, PA  ?diclofenac Sodium (VOLTAREN) 1 % GEL Apply 4 g topically 4 (four) times daily. 12/30/21  Yes Charliene Inoue, Stevphen MeuseWylder S, PA  ?lidocaine (LIDODERM) 5 % Place 1 patch onto the skin daily. Remove & Discard patch within 12 hours or as directed by MD 12/30/21  Yes Gailen ShelterFondaw, Lovene Maret S, PA  ?meloxicam (MOBIC) 7.5 MG tablet Take 1 tablet (7.5 mg total) by mouth daily. 12/30/21  Yes Ainhoa Rallo, Rodrigo RanWylder S, PA  ?methocarbamol (ROBAXIN) 500 MG tablet Take 1 tablet (500 mg total) by mouth 2 (two) times daily. 12/30/21   Gailen ShelterFondaw, Tidus Upchurch S, PA  ?naproxen (NAPROSYN) 500 MG tablet Take 1 tablet (500 mg total) by mouth 2 (two) times daily. 04/22/21   Loeffler, Finis BudGrace C, PA-C  ?   ? ?Allergies    ?Aspirin and Ibuprofen   ? ?Review of Systems   ?Review of Systems  ?Musculoskeletal:  Positive for back pain.  ? ?Physical Exam ?Updated Vital Signs ?BP 129/74 (BP Location: Left Arm)   Pulse (!) 49   Temp 97.9 ?F (36.6 ?C) (Oral)   Resp 20   Ht 5\' 7"  (1.702 m)    Wt 93 kg   SpO2 100%   BMI 32.11 kg/m?  ?Physical Exam ?Vitals and nursing note reviewed.  ?Constitutional:   ?   General: He is not in acute distress. ?HENT:  ?   Head: Normocephalic and atraumatic.  ?   Nose: Nose normal.  ?Eyes:  ?   General: No scleral icterus. ?Cardiovascular:  ?   Rate and Rhythm: Normal rate and regular rhythm.  ?   Pulses: Normal pulses.  ?   Heart sounds: Normal heart sounds.  ?Pulmonary:  ?   Effort: Pulmonary effort is normal. No respiratory distress.  ?   Breath sounds: No wheezing.  ?Abdominal:  ?   Palpations: Abdomen is soft.  ?   Tenderness: There is no abdominal tenderness.  ?Musculoskeletal:  ?   Cervical back: Normal range of motion.  ?   Right lower leg: No edema.  ?   Left lower leg: No edema.  ?   Comments: Some paralumbar muscular tenderness no midline C, T, L-spine tenderness  ?Skin: ?   General: Skin is warm and dry.  ?   Capillary Refill: Capillary refill takes less than 2 seconds.  ?Neurological:  ?   Mental Status: He is alert.  Mental status is at baseline.  ?Psychiatric:     ?   Mood and Affect: Mood normal.     ?   Behavior: Behavior normal.  ? ? ?ED Results / Procedures / Treatments   ?Labs ?(all labs ordered are listed, but only abnormal results are displayed) ?Labs Reviewed - No data to display ? ?EKG ?None ? ?Radiology ?No results found. ? ?Procedures ?Procedures  ? ? ?Medications Ordered in ED ?Medications - No data to display ? ?ED Course/ Medical Decision Making/ A&P ?  ?                        ?Medical Decision Making ?Risk ?OTC drugs. ?Prescription drug management. ? ? ?Patient is a 36 year old male he works [employer] where he does heavy lifting and states that he feels that over the course of his workday he has overdone it and developed low back pain he has had issues with this in the past.  He states that he feels "locked up " ? ?No bowel or bladder incontinence.  He states that hurts all the way across his low back no trauma to his back.  Denies any  frequency urgency dysuria hematuria.  No other associate symptoms.  No numbness or weakness in leg ? ?Broad differential for back pain considered includes malignancy, disc herniation, spinal epidural abscess, spinal fracture, cauda equina, pyelonephritis, kidney stone, AAA, AD, pancreatitis, PE and PTX.  ? ?History without symptoms of urinary or stool retention or incontinence, neurologic changes such as sensation change or weakness lower extremities, coagulopathy or blood thinner use, is not elderly or with history of osteoporosis, denies any history of cancer, fever, IV drug use, weight changes (unexplained), or prolonged steroid use.  ? ?Physical exam most consistent with muscular strain. Doubt cauda equina or disc herniation d/t lack of saddle anesthesia/bowel or bladder incontinence or urinary retention, normal gait and reassuring physical examination without neurologic deficits.  ? ?History is not supportive of kidney stone, AAA, AD, pancreatitis, PE or PTX. Patient has no CVA tenderness or urinary sx to suggest pyelonephritis or kidney stone.  ? ?Will manage patient conservatively at this time. NSAIDs, back exercises/stretches, heat therapy and follow up with PCP if symptoms do not resolve in 3-4 weeks. Patient offered muscle relaxer for comfort at night. Counseled on need to return to ED for fever, worsening or concerning symptoms. Patient agreeable to plan and states understanding of follow up plans and return precautions.  ? ? ? ? ?Vitals WNL at time of discharge and patient is no acute distress  ? ?We will discharge home with conservative therapy PCP follow-up recommended. ? ? ?Final Clinical Impression(s) / ED Diagnoses ?Final diagnoses:  ?Acute bilateral low back pain without sciatica  ? ? ?Rx / DC Orders ?ED Discharge Orders   ? ?      Ordered  ?  methocarbamol (ROBAXIN) 500 MG tablet  2 times daily       ? 12/30/21 2020  ?  lidocaine (LIDODERM) 5 %  Every 24 hours       ? 12/30/21 2020  ?  diclofenac  Sodium (VOLTAREN) 1 % GEL  4 times daily       ? 12/30/21 2020  ?  meloxicam (MOBIC) 7.5 MG tablet  Daily       ? 12/30/21 2020  ?  acetaminophen (TYLENOL) 500 MG tablet  Every 6 hours PRN       ? 12/30/21 2020  ? ?  ?  ? ?  ? ? ?  ?  Gailen Shelter, [place] ?12/30/21 2050 ? ?  ?Vanetta Mulders, MD ?12/31/21 1705 ? ?

## 2021-12-30 NOTE — ED Triage Notes (Signed)
Pt reports lower back pain x 1 day. Pain increased after standing at work all day. Pt has tried flexeril without relief. No injury reported ?

## 2022-05-11 ENCOUNTER — Emergency Department (HOSPITAL_BASED_OUTPATIENT_CLINIC_OR_DEPARTMENT_OTHER)
Admission: EM | Admit: 2022-05-11 | Discharge: 2022-05-11 | Disposition: A | Discharge status: Home / Self Care | Payer: BLUE CROSS/BLUE SHIELD | Attending: Emergency Medicine | Admitting: Emergency Medicine

## 2022-05-11 ENCOUNTER — Other Ambulatory Visit: Payer: Self-pay

## 2022-05-11 ENCOUNTER — Emergency Department (HOSPITAL_BASED_OUTPATIENT_CLINIC_OR_DEPARTMENT_OTHER): Payer: BLUE CROSS/BLUE SHIELD

## 2022-05-11 ENCOUNTER — Encounter (HOSPITAL_BASED_OUTPATIENT_CLINIC_OR_DEPARTMENT_OTHER): Payer: Self-pay | Admitting: Emergency Medicine

## 2022-05-11 DIAGNOSIS — M25512 Pain in left shoulder: Secondary | ICD-10-CM | POA: Insufficient documentation

## 2022-05-11 DIAGNOSIS — X500XXA Overexertion from strenuous movement or load, initial encounter: Secondary | ICD-10-CM | POA: Insufficient documentation

## 2022-05-11 MED ORDER — METHOCARBAMOL 500 MG PO TABS: 500.0000 mg | ORAL_TABLET | Freq: Two times a day (BID) | ORAL | 0 refills | Status: AC

## 2022-05-11 MED ORDER — NAPROXEN 500 MG PO TABS: 500.0000 mg | ORAL_TABLET | Freq: Two times a day (BID) | ORAL | 0 refills | Status: AC

## 2022-05-11 NOTE — Discharge Instructions (Addendum)
You have been evaluated for your shoulder pain.  Fortunately x-ray obtained which did not show any evidence of any broken bones or shoulder dislocation.  Your pain may likely be due to muscle strain versus a rotator cuff injury possibly a nerve impingement.  You may take anti-inflammatory medication and muscle relaxant as needed for pain.  You may follow-up with orthopedic specialist for outpatient evaluation.  If you develop fever, arm weakness, or if you have any other concerns do not hesitate to return for further assessment.

## 2022-05-11 NOTE — ED Notes (Signed)
Patient states that over the last few weeks he has had pain in his left shoulder. He states he lifts quite heavy at work (35lbs minimum) up to 50lbs. He feels like maybe he is overusing his shoulder. He is barely able to lift his elbow past his shoulder girdle. He cannot move it very far back or forward. It causes him pain to supinate and pronate his left hand and feels a sharp pain near the shoulder girdle.

## 2022-05-11 NOTE — ED Provider Notes (Signed)
[facility] EMERGENCY DEPARTMENT Provider Note   CSN: [id number] Arrival date & time: 05/11/22  1808     History  Chief Complaint  Patient presents with   Shoulder Pain    Cody Peterson is a 36 y.o. male.  The history is provided by the patient and medical records. No language interpreter was used.  Shoulder Pain  Patient is a 36 year old male presenting with a chief complaint of left shoulder pain. Patient states the pain started two days ago. He denies recent injury. Patient does a lot of heavy lifting at work and thinks it may be related. He states the pain is localized to his upper left shoulder. He states he has trouble holding his arm out and any rotating movement seems to make the pain worse. He denies neck pain, swelling, or numbness. He does report some tingling in his fingers.     Home Medications Prior to Admission medications   Medication Sig Start Date End Date Taking? Authorizing Provider  acetaminophen (TYLENOL) 500 MG tablet Take 2 tablets (1,000 mg total) by mouth every 6 (six) hours as needed. 12/30/21   Gailen Shelter, PA  diclofenac Sodium (VOLTAREN) 1 % GEL Apply 4 g topically 4 (four) times daily. 12/30/21   Gailen Shelter, PA  lidocaine (LIDODERM) 5 % Place 1 patch onto the skin daily. Remove & Discard patch within 12 hours or as directed by MD 12/30/21   Gailen Shelter, PA  meloxicam (MOBIC) 7.5 MG tablet Take 1 tablet (7.5 mg total) by mouth daily. 12/30/21   Gailen Shelter, PA  methocarbamol (ROBAXIN) 500 MG tablet Take 1 tablet (500 mg total) by mouth 2 (two) times daily. 12/30/21   Gailen Shelter, PA  naproxen (NAPROSYN) 500 MG tablet Take 1 tablet (500 mg total) by mouth 2 (two) times daily. 04/22/21   Loeffler, Finis Bud, PA-C      Allergies    Aspirin and Ibuprofen    Review of Systems   Review of Systems  All other systems reviewed and are negative.   Physical Exam Updated Vital Signs BP 136/81 (BP Location: Right Arm)   Pulse  63   Temp 98.4 F (36.9 C) (Oral)   Resp 18   Ht 5\' 6"  (1.676 m)   Wt 85.7 kg   SpO2 97%   BMI 30.51 kg/m  Physical Exam Vitals and nursing note reviewed.  Constitutional:      General: He is not in acute distress.    Appearance: He is well-developed.  HENT:     Head: Atraumatic.  Eyes:     Conjunctiva/sclera: Conjunctivae normal.  Musculoskeletal:        General: Tenderness (Left shoulder: Point tenderness to glenohumeral joint on palpation with full range of motion about the shoulder and no weakness noted.  No deformity.  No tenderness to midline cervical spine.) present.     Cervical back: Normal range of motion and neck supple. No rigidity or tenderness.  Skin:    Findings: No rash.  Neurological:     Mental Status: He is alert.     ED Results / Procedures / Treatments   Labs (all labs ordered are listed, but only abnormal results are displayed) Labs Reviewed - No data to display  EKG EKG Interpretation  Date/Time:  Monday May 11 2022 18:44:39 EDT Ventricular Rate:  60 PR Interval:  132 QRS Duration: 94 QT Interval:  404 QTC Calculation: 404 R Axis:   73  Text Interpretation: Normal sinus rhythm no acute ST/T changes No old tracing to compare Confirmed by Sherwood Gambler [id number]) on 05/11/2022 9:53:59 PM  Radiology DG Shoulder Left  Result Date: 05/11/2022 CLINICAL DATA:  Left shoulder pain. EXAM: LEFT SHOULDER - 2+ VIEW COMPARISON:  None Available. FINDINGS: There is no evidence of fracture or dislocation. There is no evidence of arthropathy or other focal bone abnormality. Soft tissues are unremarkable. IMPRESSION: Negative. Electronically Signed   By: Ronney Asters M.D.   On: 05/11/2022 19:08    Procedures Procedures    Medications Ordered in ED Medications - No data to display  ED Course/ Medical Decision Making/ A&P                           Medical Decision Making Amount and/or Complexity of Data Reviewed Radiology: ordered.   BP 136/81  (BP Location: Right Arm)   Pulse 63   Temp 98.4 F (36.9 C) (Oral)   Resp 18   Ht 5\' 6"  (1.676 m)   Wt 85.7 kg   SpO2 97%   BMI 30.51 kg/m   80:41 PM 36 year old male presenting complaining of pain to his left shoulder.  Patient report for the past several days he has had pain about his left shoulder.  Pain is described as a sharp stabbing sensation nonradiating, worse when he lift his above his shoulder.  He endorsed occasionally tingling sensation to his fingers.  He does not endorse any fever or chills no headache no neck pain no chest pain no weakness no rash.  He denies any specific trauma but states he does a job that requires heavy lifting.  He has to take yesterday off from work due to his pain.  He tries taking Tylenol at home without relief.  He is right-hand dominant.  On exam patient has point tenderness to the left glenohumeral joint while maintaining full range of motion and no deformity noted.  No overlying skin changes.  No cervical midline spine tenderness no chest tenderness.  EKG obtained independently viewed by me and is with sinus rhythm and no concerning arrhythmia or ischemic changes.  X-ray of left shoulder independently viewed interpreted by me and negative.  I agree with radiologist interpretation.  I suspect patient left shoulder pain is likely musculoskeletal strain/sprain and possibly rotator cuff injury versus nerve impingement.  I recommend rice therapy and will also provide orthopedic referral for outpatient follow-up.  Work note provided as well.  I have low suspicion for septic joint, cellulitis, shoulder dislocation, cardiac referred pain, abdominal referred pain.  Patient is afebrile with stable normal vital sign.        Final Clinical Impression(s) / ED Diagnoses Final diagnoses:  Acute pain of left shoulder    Rx / DC Orders ED Discharge Orders          Ordered    methocarbamol (ROBAXIN) 500 MG tablet  2 times daily        05/11/22 2303     naproxen (NAPROSYN) 500 MG tablet  2 times daily        05/11/22 2303              Domenic Moras, PA-C 05/11/22 2305    Sherwood Gambler, MD 05/12/22 [phone]

## 2022-05-11 NOTE — ED Triage Notes (Signed)
Patient presents POV C/O L shoulder pain onset yesterday. Pain worsens with movement. Pain radiates into center of chest. Denies injury but does do heavy lifting at work.

## 2022-07-07 ENCOUNTER — Emergency Department (HOSPITAL_BASED_OUTPATIENT_CLINIC_OR_DEPARTMENT_OTHER)
Admission: EM | Admit: 2022-07-07 | Discharge: 2022-07-08 | Disposition: A | Discharge status: Other Acute Inpatient Hospital | Payer: BLUE CROSS/BLUE SHIELD | Attending: Emergency Medicine | Admitting: Emergency Medicine

## 2022-07-07 ENCOUNTER — Encounter (HOSPITAL_BASED_OUTPATIENT_CLINIC_OR_DEPARTMENT_OTHER): Payer: Self-pay | Admitting: Emergency Medicine

## 2022-07-07 ENCOUNTER — Emergency Department (HOSPITAL_BASED_OUTPATIENT_CLINIC_OR_DEPARTMENT_OTHER): Payer: BLUE CROSS/BLUE SHIELD

## 2022-07-07 ENCOUNTER — Other Ambulatory Visit: Payer: Self-pay

## 2022-07-07 DIAGNOSIS — X58XXXA Exposure to other specified factors, initial encounter: Secondary | ICD-10-CM | POA: Insufficient documentation

## 2022-07-07 DIAGNOSIS — S0219XA Other fracture of base of skull, initial encounter for closed fracture: Secondary | ICD-10-CM

## 2022-07-07 DIAGNOSIS — S0592XA Unspecified injury of left eye and orbit, initial encounter: Secondary | ICD-10-CM | POA: Insufficient documentation

## 2022-07-07 DIAGNOSIS — S0590XA Unspecified injury of unspecified eye and orbit, initial encounter: Secondary | ICD-10-CM

## 2022-07-07 LAB — CBC WITH DIFFERENTIAL/PLATELET
Abs Immature Granulocytes: 0.01 10*3/uL (ref 0.00–0.07)
Basophils Absolute: 0.1 10*3/uL (ref 0.0–0.1)
Basophils Relative: 1 %
Eosinophils Absolute: 0.5 10*3/uL (ref 0.0–0.5)
Eosinophils Relative: 7 %
HCT: 35.3 % — ABNORMAL LOW (ref 39.0–52.0)
Hemoglobin: 13 g/dL (ref 13.0–17.0)
Immature Granulocytes: 0 %
Lymphocytes Relative: 26 %
Lymphs Abs: 1.7 10*3/uL (ref 0.7–4.0)
MCH: 29.9 pg (ref 26.0–34.0)
MCHC: 36.8 g/dL — ABNORMAL HIGH (ref 30.0–36.0)
MCV: 81.1 fL (ref 80.0–100.0)
Monocytes Absolute: 0.7 10*3/uL (ref 0.1–1.0)
Monocytes Relative: 10 %
Neutro Abs: 3.8 10*3/uL (ref 1.7–7.7)
Neutrophils Relative %: 56 %
Platelets: 272 10*3/uL (ref 150–400)
RBC: 4.35 MIL/uL (ref 4.22–5.81)
RDW: 14.2 % (ref 11.5–15.5)
WBC: 6.8 10*3/uL (ref 4.0–10.5)
nRBC: 0 % (ref 0.0–0.2)

## 2022-07-07 LAB — BASIC METABOLIC PANEL
Anion gap: 6 (ref 5–15)
BUN: 9 mg/dL (ref 6–20)
CO2: 29 mmol/L (ref 22–32)
Calcium: 9 mg/dL (ref 8.9–10.3)
Chloride: 104 mmol/L (ref 98–111)
Creatinine, Ser: 0.84 mg/dL (ref 0.61–1.24)
GFR, Estimated: >60 mL/min (ref >60)
Glucose, Bld: 101 mg/dL — ABNORMAL HIGH (ref 70–99)
Potassium: 3.7 mmol/L (ref 3.5–5.1)
Sodium: 139 mmol/L (ref 135–145)

## 2022-07-07 MED ORDER — ONDANSETRON HCL 4 MG/2ML IJ SOLN
4.0000 mg | Freq: Once | INTRAMUSCULAR | Status: AC
  Administered 2022-07-07: 4 mg via INTRAVENOUS

## 2022-07-07 MED ORDER — SODIUM CHLORIDE 0.9 % IV SOLN
2.0000 g | Freq: Once | INTRAVENOUS | Status: AC
  Administered 2022-07-07: 2 g via INTRAVENOUS

## 2022-07-07 MED ORDER — ONDANSETRON HCL 4 MG/2ML IJ SOLN
INTRAMUSCULAR | Status: AC
  Filled 2022-07-07: qty 2

## 2022-07-07 MED ORDER — MORPHINE SULFATE (PF) 4 MG/ML IV SOLN
4.0000 mg | Freq: Once | INTRAVENOUS | Status: AC
  Administered 2022-07-07: 4 mg via INTRAVENOUS

## 2022-07-07 MED ORDER — VANCOMYCIN HCL 1750 MG/350ML IV SOLN
1750.0000 mg | Freq: Once | INTRAVENOUS | Status: DC
  Filled 2022-07-07: qty 350

## 2022-07-07 MED ORDER — MORPHINE SULFATE (PF) 4 MG/ML IV SOLN
INTRAVENOUS | Status: AC
  Filled 2022-07-07: qty 1

## 2022-07-07 MED ORDER — VANCOMYCIN HCL 750 MG/150ML IV SOLN
750.0000 mg | Freq: Once | INTRAVENOUS | Status: AC
  Administered 2022-07-08: 750 mg via INTRAVENOUS
  Filled 2022-07-07: qty 150

## 2022-07-07 MED ORDER — IOHEXOL 300 MG/ML  SOLN
75.0000 mL | Freq: Once | INTRAMUSCULAR | Status: AC | PRN
  Administered 2022-07-07: 75 mL via INTRAVENOUS

## 2022-07-07 MED ORDER — VANCOMYCIN HCL 1500 MG/300ML IV SOLN
1500.0000 mg | Freq: Two times a day (BID) | INTRAVENOUS | Status: DC
  Filled 2022-07-07: qty 300

## 2022-07-07 MED ORDER — VANCOMYCIN HCL IN DEXTROSE 1-5 GM/200ML-% IV SOLN
1000.0000 mg | Freq: Once | INTRAVENOUS | Status: AC
  Administered 2022-07-08: 1000 mg via INTRAVENOUS
  Filled 2022-07-07: qty 200

## 2022-07-07 MED ORDER — SODIUM CHLORIDE 0.9 % IV SOLN
INTRAVENOUS | Status: AC
  Filled 2022-07-07: qty 20
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
## 2022-07-07 NOTE — ED Notes (Signed)
Called [facility] for oculo plastic consult @ 2106

## 2022-07-07 NOTE — ED Notes (Signed)
Called [facility] for ENT face trauma consult @ 2129
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
## 2022-07-07 NOTE — ED Notes (Signed)
Called [facility] for oculo plastic under ophthalmology consult @ 2159, faxed demogrpahics to [phone]

## 2022-07-08 ENCOUNTER — Encounter (HOSPITAL_BASED_OUTPATIENT_CLINIC_OR_DEPARTMENT_OTHER): Payer: Self-pay

## 2022-07-08 MED ORDER — VANCOMYCIN HCL 750 MG IV SOLR
INTRAVENOUS | Status: AC
  Filled 2022-07-08: qty 15

## 2022-07-08 MED ORDER — MORPHINE SULFATE (PF) 4 MG/ML IV SOLN
4.0000 mg | Freq: Once | INTRAVENOUS | Status: AC
  Administered 2022-07-08: 4 mg via INTRAVENOUS
  Filled 2022-07-08: qty 1

## 2022-07-08 NOTE — ED Notes (Signed)
Called [facility] for transportation

## 2023-12-14 ENCOUNTER — Encounter (HOSPITAL_BASED_OUTPATIENT_CLINIC_OR_DEPARTMENT_OTHER): Payer: Self-pay

## 2023-12-14 ENCOUNTER — Emergency Department (HOSPITAL_BASED_OUTPATIENT_CLINIC_OR_DEPARTMENT_OTHER)
Admission: EM | Admit: 2023-12-14 | Discharge: 2023-12-14 | Disposition: A | Discharge status: Home / Self Care | Payer: Self-pay | Attending: Emergency Medicine | Admitting: Emergency Medicine

## 2023-12-14 ENCOUNTER — Other Ambulatory Visit: Payer: Self-pay

## 2023-12-14 DIAGNOSIS — F17200 Nicotine dependence, unspecified, uncomplicated: Secondary | ICD-10-CM | POA: Insufficient documentation

## 2023-12-14 DIAGNOSIS — J0100 Acute maxillary sinusitis, unspecified: Secondary | ICD-10-CM | POA: Insufficient documentation

## 2023-12-14 DIAGNOSIS — J45909 Unspecified asthma, uncomplicated: Secondary | ICD-10-CM | POA: Insufficient documentation

## 2023-12-14 LAB — RESP PANEL BY RT-PCR (RSV, FLU A&B, COVID)  RVPGX2
Influenza A by PCR: NEGATIVE
Influenza B by PCR: NEGATIVE
Resp Syncytial Virus by PCR: NEGATIVE
SARS Coronavirus 2 by RT PCR: NEGATIVE

## 2023-12-14 MED ORDER — AMOXICILLIN-POT CLAVULANATE 875-125 MG PO TABS: 1.0000 | ORAL_TABLET | Freq: Two times a day (BID) | ORAL | 0 refills | Status: AC

## 2023-12-14 MED ORDER — AZELASTINE HCL 0.1 % NA SOLN: 1.0000 | Freq: Two times a day (BID) | NASAL | 12 refills | Status: AC

## 2023-12-14 MED ORDER — AMOXICILLIN-POT CLAVULANATE 875-125 MG PO TABS
1.0000 | ORAL_TABLET | Freq: Once | ORAL | Status: AC
  Administered 2023-12-14: 1 via ORAL
  Filled 2023-12-14: qty 1

## 2023-12-14 NOTE — Discharge Instructions (Addendum)
 Take Augmentin twice a day for the next week to help with sinus infection.  It will take about 2 to 3 days of antibiotics to kick in.  Continue taking your Claritin daily. Use Astelin nasal spray up to twice a day as needed for additional relief.  I have put information for ENT to follow up with if you symptoms persist after the antibiotics.  Get help right away if: You have a very bad headache. You cannot stop vomiting. You have very bad pain or swelling around your face or eyes. You have trouble seeing. You feel confused. Your neck is stiff. You have trouble breathing.

## 2023-12-14 NOTE — ED Provider Notes (Signed)
 [facility] Provider Note   CSN: [id number] Arrival date & time: 12/14/23  1842     History  Chief Complaint  Patient presents with   Nasal Congestion    Cody Peterson is a 38 y.o. male with a history of asthma and sarcoidosis who presents to the ED today for congestion.  Patient reports watery eye for the past 2 to 3 months.  States that he has tried Claritin, Zyrtec, Mucinex, Sudafed, with some improvement of symptoms, but they will just return.  Denies any associated fevers, nausea, vomiting, sore throat, or ear pain.  Endorses seasonal allergies.  No known sick contact.  Says that he wakes up in the middle of the night with pain underneath the right eye and increased right-sided nasal congestion.  No additional complaints or concerns at this time.    Home Medications Prior to Admission medications   Medication Sig Start Date End Date Taking? Authorizing Provider  amoxicillin-clavulanate (AUGMENTIN) 875-125 MG tablet Take 1 tablet by mouth every 12 (twelve) hours for 7 days. 12/14/23 12/21/23 Yes Sonnie Dusky, PA-C  azelastine (ASTELIN) 0.1 % nasal spray Place 1 spray into both nostrils 2 (two) times daily for 7 days. Use in each nostril as directed 12/14/23 12/21/23 Yes Sonnie Dusky, PA-C  acetaminophen  (TYLENOL ) 500 MG tablet Take 1 tablet (500 mg total) by mouth every 6 (six) hours as needed for mild pain or moderate pain. 01/20/16   Juliette Oh, MD  albuterol  (PROVENTIL  HFA;VENTOLIN  HFA) 108 (90 BASE) MCG/ACT inhaler Inhale 2 puffs into the lungs every 6 (six) hours as needed.    [provider]  clindamycin  (CLEOCIN ) 150 MG capsule Take 2 capsules (300 mg total) by mouth 3 (three) times daily. May dispense as 150mg  capsules 11/30/14   Szekalski, Kaitlyn, PA-C  cyclobenzaprine  (FLEXERIL ) 10 MG tablet Take 1 tablet (10 mg total) by mouth 3 (three) times daily as needed for muscle spasms. 01/20/16   Juliette Oh, MD   HYDROcodone -acetaminophen  (NORCO/VICODIN) 5-325 MG per tablet Take 2 tablets by mouth every 4 (four) hours as needed. 11/30/14   Szekalski, Kaitlyn, PA-C  hydrocortisone 1 % lotion Apply topically 2 (two) times daily as needed. For eczema    [provider]  loperamide  (IMODIUM ) 2 MG capsule Take 1 capsule (2 mg total) by mouth 4 (four) times daily as needed for up to 5 doses for diarrhea or loose stools. 11/23/21   Loeffler, Egbert Grass, PA-C  predniSONE  (DELTASONE ) 10 MG tablet One- half tablet  daily 10/15/11   Vernell Goldsmith, MD      Allergies    Aspirin, Ivp dye [iodinated contrast media], Nutritional supplements, and Shellfish allergy     Review of Systems   Review of Systems  HENT:  Positive for congestion.   All other systems reviewed and are negative.   Physical Exam Updated Vital Signs BP (!) 164/101   Pulse 73   Temp 98.1 F (36.7 C) (Oral)   Resp 18   Wt 97.5 kg   SpO2 98%   BMI 33.67 kg/m  Physical Exam Vitals and nursing note reviewed.  Constitutional:      Appearance: Normal appearance.  HENT:     Head: Normocephalic and atraumatic.     Nose: Congestion present.     Comments: Right-sided nasal congestion appreciated on exam    Mouth/Throat:     Mouth: Mucous membranes are moist.  Eyes:     Conjunctiva/sclera: Conjunctivae normal.  Pupils: Pupils are equal, round, and reactive to light.  Cardiovascular:     Rate and Rhythm: Normal rate and regular rhythm.     Pulses: Normal pulses.  Pulmonary:     Effort: Pulmonary effort is normal.     Breath sounds: Normal breath sounds.  Abdominal:     Palpations: Abdomen is soft.     Tenderness: There is no abdominal tenderness.  Skin:    General: Skin is warm and dry.     Findings: No rash.  Neurological:     General: No focal deficit present.     Mental Status: He is alert.  Psychiatric:        Mood and Affect: Mood normal.        Behavior: Behavior normal.    ED Results / Procedures / Treatments    Labs (all labs ordered are listed, but only abnormal results are displayed) Labs Reviewed  RESP PANEL BY RT-PCR (RSV, FLU A&B, COVID)  [id number]    EKG None  Radiology No results found.  Procedures Procedures    Medications Ordered in ED Medications  amoxicillin-clavulanate (AUGMENTIN) 875-125 MG per tablet 1 tablet (has no administration in time range)    ED Course/ Medical Decision Making/ A&P                                 Medical Decision Making  This patient presents to the ED for concern of nasal congestion, this involves an extensive number of treatment options, and is a complaint that carries with it a high risk of complications and morbidity.   Differential diagnosis includes: Flu, COVID, RSV, other viral illness, sinusitis, allergies, etc.   Comorbidities  See HPI above   Additional History  Additional history obtained from patient   Lab Tests  I ordered and personally interpreted labs.  The pertinent results include:   Negative respiratory swab   Problem List / ED Course / Critical Interventions / Medication Management  Patient reports nasal congestion, itchy nose, and watery right eye for the past 2 to 3 months.  He states that he has tried over-the-counter antihistamines, Mucinex, Sudafed, and intranasal sprays with out resolution of symptoms.  States that he is taking Claritin daily without much improvement.  Denies any cough, sore throat, or ear pain.  Eyes are not itchy but he does wake up middle the night and sometimes he will have pain underneath his right eye and his right eye will water. I ordered medications including: Augmentin for sinus infection Medication given prior to discharge I have reviewed the patients home medicines and have made adjustments as needed Since patient endorses intermittent pain in the right eye will treat for possible maxillary sinusitis.  First dose Augmentin given in the ED today.  Prescription to the pharmacy.   Instructed continue taking Claritin for possible allergic rhinitis component.   Social Determinants of Health  Tobacco use   Test / Admission - Considered  Patient is hemodynamically stable and safe discharge home. Return precautions given.       Final Clinical Impression(s) / ED Diagnoses Final diagnoses:  Acute non-recurrent maxillary sinusitis    Rx / DC Orders ED Discharge Orders          Ordered    amoxicillin-clavulanate (AUGMENTIN) 875-125 MG tablet  Every 12 hours        12/14/23 2010    azelastine (ASTELIN) 0.1 % nasal spray  2  times daily        12/14/23 2012              Sonnie Dusky, PA-C 12/14/23 2019    Hershel Los, MD 12/14/23 2203

## 2023-12-14 NOTE — ED Triage Notes (Signed)
 Nasal congestion x 1 year. Continually blowing nose. Complains of nose itching. 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
